# Patient Record
Sex: Male | Born: 1995 | Race: Black or African American | Hispanic: No | Marital: Single | Smoking: Current every day smoker
Health system: Southern US, Community
[De-identification: ages and names within clinical notes are randomized; demographics above are authoritative.]

## PROBLEM LIST (undated history)

## (undated) DIAGNOSIS — A64 Unspecified sexually transmitted disease: Secondary | ICD-10-CM

## (undated) DIAGNOSIS — T300 Burn of unspecified body region, unspecified degree: Secondary | ICD-10-CM

## (undated) HISTORY — PX: SKIN GRAFT: SHX250

---
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
## 2014-11-23 ENCOUNTER — Emergency Department (HOSPITAL_COMMUNITY)
Admission: EM | Admit: 2014-11-23 | Discharge: 2014-11-23 | Discharge status: Against Medical Advice | Payer: Self-pay | Attending: Emergency Medicine | Admitting: Emergency Medicine

## 2014-11-23 ENCOUNTER — Emergency Department (HOSPITAL_COMMUNITY): Payer: Medicaid - Out of State

## 2014-11-23 ENCOUNTER — Encounter (HOSPITAL_COMMUNITY): Payer: Self-pay | Admitting: Emergency Medicine

## 2014-11-23 DIAGNOSIS — R05 Cough: Secondary | ICD-10-CM | POA: Insufficient documentation

## 2014-11-23 DIAGNOSIS — R079 Chest pain, unspecified: Secondary | ICD-10-CM | POA: Insufficient documentation

## 2014-11-23 DIAGNOSIS — Z720 Tobacco use: Secondary | ICD-10-CM | POA: Insufficient documentation

## 2014-11-23 NOTE — ED Notes (Signed)
Pt states that when he breathes he has pain in center of chest.  Has been coughing up clear and yellow mucous.

## 2015-04-02 ENCOUNTER — Emergency Department (HOSPITAL_COMMUNITY)
Admission: EM | Admit: 2015-04-02 | Discharge: 2015-04-02 | Disposition: A | Discharge status: Home / Self Care | Payer: Self-pay | Attending: Emergency Medicine | Admitting: Emergency Medicine

## 2015-04-02 ENCOUNTER — Encounter (HOSPITAL_COMMUNITY): Payer: Self-pay | Admitting: *Deleted

## 2015-04-02 DIAGNOSIS — Z880 Allergy status to penicillin: Secondary | ICD-10-CM | POA: Insufficient documentation

## 2015-04-02 DIAGNOSIS — L0291 Cutaneous abscess, unspecified: Secondary | ICD-10-CM

## 2015-04-02 DIAGNOSIS — Z720 Tobacco use: Secondary | ICD-10-CM | POA: Insufficient documentation

## 2015-04-02 DIAGNOSIS — Z79899 Other long term (current) drug therapy: Secondary | ICD-10-CM | POA: Insufficient documentation

## 2015-04-02 DIAGNOSIS — L02214 Cutaneous abscess of groin: Secondary | ICD-10-CM | POA: Insufficient documentation

## 2015-04-02 DIAGNOSIS — Z792 Long term (current) use of antibiotics: Secondary | ICD-10-CM | POA: Insufficient documentation

## 2015-04-02 MED ORDER — LIDOCAINE-EPINEPHRINE-TETRACAINE (LET) SOLUTION
3.0000 mL | Freq: Once | NASAL | Status: AC
  Administered 2015-04-02: 3 mL via TOPICAL
  Filled 2015-04-02: qty 3

## 2015-04-02 MED ORDER — SULFAMETHOXAZOLE-TRIMETHOPRIM 800-160 MG PO TABS: 1.0000 | ORAL_TABLET | Freq: Two times a day (BID) | ORAL | Status: AC

## 2015-04-02 MED ORDER — HYDROCODONE-ACETAMINOPHEN 5-325 MG PO TABS: ORAL_TABLET | ORAL | Status: DC

## 2015-04-02 MED ORDER — IBUPROFEN 800 MG PO TABS: 800.0000 mg | ORAL_TABLET | Freq: Three times a day (TID) | ORAL | Status: DC

## 2015-04-02 MED ORDER — POVIDONE-IODINE 10 % EX SOLN
CUTANEOUS | Status: AC
  Administered 2015-04-02: 11:00:00
  Filled 2015-04-02: qty 118

## 2015-04-02 MED ORDER — LIDOCAINE HCL (PF) 2 % IJ SOLN
10.0000 mL | Freq: Once | INTRAMUSCULAR | Status: AC
  Administered 2015-04-02: 10 mL via INTRADERMAL
  Filled 2015-04-02: qty 10

## 2015-04-02 NOTE — ED Provider Notes (Signed)
CSN: [id number]     Arrival date & time 04/02/15  0905 History   First MD Initiated Contact with Patient 04/02/15 [phone]     Chief Complaint  Patient presents with  . Abscess     (Consider location/radiation/quality/duration/timing/severity/associated sxs/prior Treatment) HPI   Bobby Scott is a 19 y.o. male who presents to the Emergency Department complaining of painful, swollen area to the right groin.  Symptoms began 3 days ago.  States the area as small at onset and has gotten larger and more painful.  Pain is worse with walking and movement of the right leg.  He reports this is a recurring problem.  He denies abdominal pain, N/V, fever, pain or swelling to his testicle and dysuria.     History reviewed. No pertinent past medical history. Past Surgical History  Procedure Laterality Date  . Skin graft     No family history on file. Social History  Substance Use Topics  . Smoking status: Current Some Day Smoker    Types: Cigarettes  . Smokeless tobacco: None  . Alcohol Use: Yes     Comment: rarely    Review of Systems  Constitutional: Negative for fever and chills.  Gastrointestinal: Negative for nausea and vomiting.  Musculoskeletal: Negative for joint swelling and arthralgias.  Skin: Positive for color change.       Abscess   Hematological: Negative for adenopathy.  All other systems reviewed and are negative.     Allergies  Bee venom and Penicillins  Home Medications   Prior to Admission medications   Medication Sig Start Date End Date Taking? Authorizing Keionna Kinnaird  ciprofloxacin (CIPRO) 500 MG tablet Take 1 tablet (500 mg total) by mouth 2 (two) times daily. 09/25/13   Sunnie Nielsen, MD  meclizine (ANTIVERT) 25 MG tablet Take 1 tablet (25 mg total) by mouth 4 (four) times daily. 09/25/13   Sunnie Nielsen, MD  promethazine (PHENERGAN) 25 MG tablet Take 1 tablet (25 mg total) by mouth every 6 (six) hours as needed for nausea or vomiting. 12/28/13   Bethann Berkshire, MD   traMADol (ULTRAM) 50 MG tablet Take 1 tablet (50 mg total) by mouth every 6 (six) hours as needed. 12/28/13   Bethann Berkshire, MD   BP 105/71 mmHg  Pulse 90  Temp(Src) 98.3 F (36.8 C) (Oral)  Resp 18  Ht  (1.905 m)  Wt 160 lb (72.576 kg)  BMI 20.00 kg/m2  SpO2 99% Physical Exam  Constitutional: He is oriented to person, place, and time. He appears well-developed and well-nourished. No distress.  HENT:  Head: Normocephalic and atraumatic.  Cardiovascular: Normal rate, regular rhythm and normal heart sounds.   No murmur heard. Pulmonary/Chest: Effort normal and breath sounds normal. No respiratory distress.  Abdominal: Soft. He exhibits no distension. There is no tenderness. There is no rebound and no guarding.  Musculoskeletal: Normal range of motion.  Neurological: He is alert and oriented to person, place, and time. He exhibits normal muscle tone. Coordination normal.  Skin: Skin is warm and dry. There is erythema.  3 cm Abscess to the right groin.  Mild erythema, moderate fluctuance  Nursing note and vitals reviewed.   ED Course  Procedures (including critical care time) Labs Review Labs Reviewed - No data to display  Imaging Review No results found. I have personally reviewed and evaluated these images and lab results as part of my medical decision-making.   EKG Interpretation None      INCISION AND DRAINAGE Performed by:  TRIPLETT,TAMMY L. Consent: Verbal consent obtained. Risks and benefits: risks, benefits and alternatives were discussed Type: abscess  Body area: right groin  Anesthesia: local infiltration  Incision was made with a #11 scalpel.  Local anesthetic: LET,  lidocaine 2 % w/o epinephrine  Anesthetic total: 3 ml,  2 ml  Complexity: complex Blunt dissection to break up loculations  Drainage: purulent  Drainage amount: moderate  Packing material: 1/4 in iodoform gauze  Patient tolerance: Patient tolerated the procedure well with no  immediate complications.    MDM   Final diagnoses:  Abscess    Pt is well appearing.  Non-toxic.  Vitals stable.  Abscess to the groin without involvement to the testicle.  He agrees to warm soaks and packing removal in 2 days.  Advised to return here for any worsening symptoms    Pauline Aus, PA-C 04/02/15 1111  Bethann Berkshire, MD 04/02/15 1432

## 2015-04-02 NOTE — ED Notes (Signed)
Gauze dressing applied to site per PA request.

## 2015-04-02 NOTE — Discharge Instructions (Signed)
Abscess °An abscess (boil or furuncle) is an infected area on or under the skin. This area is filled with yellowish-white fluid (pus) and other material (debris). °HOME CARE  °· Only take medicines as told by your doctor. °· If you were given antibiotic medicine, take it as directed. Finish the medicine even if you start to feel better. °· If gauze is used, follow your doctor's directions for changing the gauze. °· To avoid spreading the infection: °¨ Keep your abscess covered with a bandage. °¨ Wash your hands well. °¨ Do not share personal care items, towels, or whirlpools with others. °¨ Avoid skin contact with others. °· Keep your skin and clothes clean around the abscess. °· Keep all doctor visits as told. °GET HELP RIGHT AWAY IF:  °· You have more pain, puffiness (swelling), or redness in the wound site. °· You have more fluid or blood coming from the wound site. °· You have muscle aches, chills, or you feel sick. °· You have a fever. °MAKE SURE YOU:  °· Understand these instructions. °· Will watch your condition. °· Will get help right away if you are not doing well or get worse. °Document Released: 12/13/2007 Document Revised: 12/26/2011 Document Reviewed: 09/08/2011 °[employer]® Patient Information ©2015 [employer]. This information is not intended to replace advice given to you by your health care provider. Make sure you discuss any questions you have with your health care provider. ° °

## 2015-04-02 NOTE — ED Notes (Signed)
Pt c/o "boil" to right inner thigh/groin area causing a lot pain that started 3-4 days ago. Skin is red and swollen, very tender. No pain in testicles. Pt reports increasing pain and edema over the last several days.

## 2015-04-02 NOTE — ED Notes (Signed)
Abscess to right inner upper thigh first noticed 3 days ago. Pt states area has not been draining.

## 2015-04-05 ENCOUNTER — Emergency Department (HOSPITAL_COMMUNITY)
Admission: EM | Admit: 2015-04-05 | Discharge: 2015-04-05 | Disposition: A | Discharge status: Home / Self Care | Payer: Self-pay | Attending: Emergency Medicine | Admitting: Emergency Medicine

## 2015-04-05 ENCOUNTER — Encounter (HOSPITAL_COMMUNITY): Payer: Self-pay | Admitting: Emergency Medicine

## 2015-04-05 DIAGNOSIS — Z720 Tobacco use: Secondary | ICD-10-CM | POA: Insufficient documentation

## 2015-04-05 DIAGNOSIS — Z791 Long term (current) use of non-steroidal anti-inflammatories (NSAID): Secondary | ICD-10-CM | POA: Insufficient documentation

## 2015-04-05 DIAGNOSIS — Z5189 Encounter for other specified aftercare: Secondary | ICD-10-CM

## 2015-04-05 DIAGNOSIS — Z880 Allergy status to penicillin: Secondary | ICD-10-CM | POA: Insufficient documentation

## 2015-04-05 DIAGNOSIS — Z4801 Encounter for change or removal of surgical wound dressing: Secondary | ICD-10-CM | POA: Insufficient documentation

## 2015-04-05 NOTE — ED Notes (Signed)
Pt made aware to return if symptoms worsen or if any life threatening symptoms occur.   

## 2015-04-05 NOTE — Discharge Instructions (Signed)

## 2015-04-05 NOTE — ED Provider Notes (Signed)
CSN: [id number]     Arrival date & time 04/05/15  4098 History  This chart was scribed for Bobby Octave, MD by Tanda Rockers, ED Scribe. This patient was seen in room [facility]06/[facility] and the patient's care was started at 9:15 AM.  Chief Complaint  Patient presents with  . Wound Check   The history is provided by the patient. No language interpreter was used.     HPI Comments: Bobby Scott is a 19 y.o. male who presents to the Emergency Department for wound check. Pt was seen in ED on Friday, 04/02/2015 (approxiamtely 4 days ago) for abscess to right groin x 3 days. He had I&D with packing done at that time and was prescribed antibiotics. Pt reports persistent pain to the area and drainage. He states he has been complaint with the antibiotics and had 3 more days left. Denies fever, chills, nausea, vomiting, or any other associated symptoms.    History reviewed. No pertinent past medical history. Past Surgical History  Procedure Laterality Date  . Skin graft     History reviewed. No pertinent family history. Social History  Substance Use Topics  . Smoking status: Current Some Day Smoker    Types: Cigarettes  . Smokeless tobacco: None  . Alcohol Use: Yes     Comment: rarely    Review of Systems  A complete 10 system review of systems was obtained and all systems are negative except as noted in the HPI and PMH.    Allergies  Bee venom and Penicillins  Home Medications   Prior to Admission medications   Medication Sig Start Date End Date Taking? Authorizing Provider  HYDROcodone-acetaminophen (NORCO/VICODIN) 5-325 MG per tablet Take one-two tabs po q 4-6 hrs prn pain 04/02/15  Yes Tammy Triplett, PA-C  naproxen sodium (ANAPROX) 220 MG tablet Take 220 mg by mouth 2 (two) times daily with a meal.   Yes Historical Provider, MD  sulfamethoxazole-trimethoprim (BACTRIM DS,SEPTRA DS) 800-160 MG per tablet Take 1 tablet by mouth 2 (two) times daily. For 10 days 04/02/15 04/09/15 Yes  Tammy Triplett, PA-C  ibuprofen (ADVIL,MOTRIN) 800 MG tablet Take 1 tablet (800 mg total) by mouth 3 (three) times daily. Patient not taking: Reported on 04/05/2015 04/02/15   Pauline Aus, PA-C   Triage Vitals: BP 119/61 mmHg  Pulse 72  Temp(Src) 97.6 F (36.4 C) (Oral)  Resp 18  Ht  (1.905 m)  Wt 160 lb (72.576 kg)  BMI 20.00 kg/m2  SpO2 100%   Physical Exam  Constitutional: He is oriented to person, place, and time. He appears well-developed and well-nourished. No distress.  Smells like marijuana  HENT:  Head: Normocephalic and atraumatic.  Mouth/Throat: Oropharynx is clear and moist. No oropharyngeal exudate.  Eyes: Conjunctivae and EOM are normal. Pupils are equal, round, and reactive to light.  Neck: Normal range of motion. Neck supple.  No meningismus.  Cardiovascular: Normal rate, regular rhythm, normal heart sounds and intact distal pulses.   No murmur heard. Pulmonary/Chest: Effort normal and breath sounds normal. No respiratory distress.  Abdominal: Soft. There is no tenderness. There is no rebound and no guarding.  Musculoskeletal: Normal range of motion. He exhibits no edema or tenderness.  Neurological: He is alert and oriented to person, place, and time. No cranial nerve deficit. He exhibits normal muscle tone. Coordination normal.  No ataxia on finger to nose bilaterally. No pronator drift. 5/5 strength throughout. CN 2-12 intact. Negative Romberg. Equal grip strength. Sensation intact. Gait is normal.  Skin: Skin is warm.  Healing abscess to right inner thigh.  Packing removed. Mild induration. No fluctuance. No surrounding erythema. No involvement of scrotum.   Psychiatric: He has a normal mood and affect. His behavior is normal.  Nursing note and vitals reviewed.   ED Course  Procedures (including critical care time)  DIAGNOSTIC STUDIES: Oxygen Saturation is 100% on RA, normal by my interpretation.    COORDINATION OF CARE: 9:20 AM-Discussed  treatment plan which includes packing removal, continuation of antibiotics, and taking warm baths with pt at bedside and pt agreed to plan.   Labs Review Labs Reviewed - No data to display  Imaging Review No results found.   EKG Interpretation None      MDM   Final diagnoses:  Wound check, abscess   Wound check to the abscess drained in right inner thigh 3 days ago. No fever no vomiting. Taking antibiotics as prescribed.  Packing removed. Wound appears to be healing appropriately, no fluctuance or significant erythema. Mild induration persists.  Patient instructed on continuing warm soaks, antibiotics, routine follow with PCP, urgent return precautions discussed.  I personally performed the services described in this documentation, which was scribed in my presence. The recorded information has been reviewed and is accurate.     Stormey Wilborn RGlynn Octave9/26/16 [phone]

## 2015-04-05 NOTE — ED Notes (Signed)
Dressing applied to wound.  

## 2015-04-05 NOTE — ED Notes (Signed)
PT states here for a wound re-check and packing removed that was drained on 04/01/15 at this ED.

## 2015-05-03 ENCOUNTER — Encounter (HOSPITAL_COMMUNITY): Payer: Self-pay | Admitting: Emergency Medicine

## 2015-05-03 ENCOUNTER — Emergency Department (HOSPITAL_COMMUNITY)
Admission: EM | Admit: 2015-05-03 | Discharge: 2015-05-04 | Disposition: A | Discharge status: Home / Self Care | Payer: Self-pay | Attending: Emergency Medicine | Admitting: Emergency Medicine

## 2015-05-03 DIAGNOSIS — Y9367 Activity, basketball: Secondary | ICD-10-CM | POA: Insufficient documentation

## 2015-05-03 DIAGNOSIS — Z880 Allergy status to penicillin: Secondary | ICD-10-CM | POA: Insufficient documentation

## 2015-05-03 DIAGNOSIS — Y998 Other external cause status: Secondary | ICD-10-CM | POA: Insufficient documentation

## 2015-05-03 DIAGNOSIS — Y92310 Basketball court as the place of occurrence of the external cause: Secondary | ICD-10-CM | POA: Insufficient documentation

## 2015-05-03 DIAGNOSIS — S9031XA Contusion of right foot, initial encounter: Secondary | ICD-10-CM | POA: Insufficient documentation

## 2015-05-03 DIAGNOSIS — Z720 Tobacco use: Secondary | ICD-10-CM | POA: Insufficient documentation

## 2015-05-03 DIAGNOSIS — Z791 Long term (current) use of non-steroidal anti-inflammatories (NSAID): Secondary | ICD-10-CM | POA: Insufficient documentation

## 2015-05-03 DIAGNOSIS — W1839XA Other fall on same level, initial encounter: Secondary | ICD-10-CM | POA: Insufficient documentation

## 2015-05-03 NOTE — ED Notes (Signed)
Pt c/o rt great toe pain and pain to the top of that foot and knee after falling yesterday.

## 2015-05-04 ENCOUNTER — Emergency Department (HOSPITAL_COMMUNITY): Payer: Self-pay

## 2015-05-04 MED ORDER — IBUPROFEN 800 MG PO TABS: 800.0000 mg | ORAL_TABLET | Freq: Three times a day (TID) | ORAL | Status: DC

## 2015-05-04 NOTE — ED Provider Notes (Signed)
CSN: [id number]     Arrival date & time 05/03/15  2324 History   First MD Initiated Contact with Patient 05/03/15 2335     Chief Complaint  Patient presents with  . Foot Pain     (Consider location/radiation/quality/duration/timing/severity/associated sxs/prior Treatment) Patient is a 19 y.o. male presenting with lower extremity pain. The history is provided by the patient.  Foot Pain This is a new problem. The current episode started yesterday (Pt reports pain and swelling in his right great toe and dorsal foot when he fell yesteday landing awkwardly on the foot while playing basketball.  He is unsure of the exact position of the foot when the fall occured.). The problem occurs constantly. The problem has been gradually worsening. Associated symptoms include arthralgias and joint swelling. Pertinent negatives include no fever, myalgias, numbness or weakness. The symptoms are aggravated by walking and bending. He has tried NSAIDs for the symptoms. The treatment provided no relief.    History reviewed. No pertinent past medical history. Past Surgical History  Procedure Laterality Date  . Skin graft     History reviewed. No pertinent family history. Social History  Substance Use Topics  . Smoking status: Current Some Day Smoker    Types: Cigarettes  . Smokeless tobacco: None  . Alcohol Use: Yes     Comment: rarely    Review of Systems  Constitutional: Negative for fever.  Musculoskeletal: Positive for joint swelling and arthralgias. Negative for myalgias.  Neurological: Negative for weakness and numbness.      Allergies  Bee venom and Penicillins  Home Medications   Prior to Admission medications   Medication Sig Start Date End Date Taking? Authorizing Provider  HYDROcodone-acetaminophen (NORCO/VICODIN) 5-325 MG per tablet Take one-two tabs po q 4-6 hrs prn pain 04/02/15   Tammy Triplett, PA-C  ibuprofen (ADVIL,MOTRIN) 800 MG tablet Take 1 tablet (800 mg total) by mouth 3  (three) times daily. 05/04/15   Burgess Amor, PA-C  naproxen sodium (ANAPROX) 220 MG tablet Take 220 mg by mouth 2 (two) times daily with a meal.    Historical Provider, MD   BP 132/74 mmHg  Pulse 74  Temp(Src) 98 F (36.7 C)  Resp 18  Ht  (1.905 m)  Wt 150 lb (68.04 kg)  BMI 18.75 kg/m2  SpO2 99% Physical Exam  Constitutional: He appears well-developed and well-nourished.  HENT:  Head: Atraumatic.  Neck: Normal range of motion.  Cardiovascular:  Pulses:      Dorsalis pedis pulses are 2+ on the right side, and 2+ on the left side.  Pulses equal bilaterally.  Distal sensation intact.  Musculoskeletal:       Left foot: There is tenderness, bony tenderness and swelling. There is normal capillary refill, no crepitus and no deformity.       Feet:  ttp right distal dorsal foot along with mild edema at the mtp of great toe.    Neurological: He is alert. He has normal strength. He displays normal reflexes. No sensory deficit.  Skin: Skin is warm and dry.  Psychiatric: He has a normal mood and affect.    ED Course  Procedures (including critical care time) Labs Review Labs Reviewed - No data to display  Imaging Review Dg Foot Complete Right  05/04/2015  CLINICAL DATA:  19 year old male with history of trauma after rolling his right foot while playing basketball yesterday. Pain across the top of the metatarsals. EXAM: RIGHT FOOT COMPLETE - 3+ VIEW COMPARISON:  Right foot radiograph  07/30/2010. FINDINGS: Multiple views of the right foot demonstrate no acute displaced fracture, subluxation, dislocation, or soft tissue abnormality. IMPRESSION: No acute radiographic abnormality of the right foot. Electronically Signed   By: Trudie Reedaniel  Entrikin M.D.   On: 05/04/2015 00:32   I have personally reviewed and evaluated these images and lab results as part of my medical decision-making.   EKG Interpretation None      MDM   Final diagnoses:  Foot contusion, right, initial encounter      Radiological studies were viewed, interpreted and considered during the medical decision making and disposition process. I agree with radiologists reading.  Results were also discussed with patient. Pt placed in post op shoe.  Rest, ice, elevation, ibuprofen.  Ortho referral given for prn f/u if not improved over the next 10 days.  The patient appears reasonably screened and/or stabilized for discharge and I doubt any other medical condition or other [facility] requiring further screening, evaluation, or treatment in the ED at this time prior to discharge.      Burgess AmorJulie Kiaria Quinnell, PA-C 05/04/15 0050  Layla MawKristen N Ward, DO 05/04/15 [phone]

## 2015-05-04 NOTE — Discharge Instructions (Signed)

## 2015-09-19 ENCOUNTER — Encounter (HOSPITAL_COMMUNITY): Payer: Self-pay | Admitting: *Deleted

## 2015-09-19 ENCOUNTER — Emergency Department (HOSPITAL_COMMUNITY)
Admission: EM | Admit: 2015-09-19 | Discharge: 2015-09-19 | Disposition: A | Discharge status: Home / Self Care | Payer: Self-pay | Attending: Emergency Medicine | Admitting: Emergency Medicine

## 2015-09-19 DIAGNOSIS — R21 Rash and other nonspecific skin eruption: Secondary | ICD-10-CM | POA: Insufficient documentation

## 2015-09-19 DIAGNOSIS — F17210 Nicotine dependence, cigarettes, uncomplicated: Secondary | ICD-10-CM | POA: Insufficient documentation

## 2015-09-19 DIAGNOSIS — Z791 Long term (current) use of non-steroidal anti-inflammatories (NSAID): Secondary | ICD-10-CM | POA: Insufficient documentation

## 2015-09-19 DIAGNOSIS — Z880 Allergy status to penicillin: Secondary | ICD-10-CM | POA: Insufficient documentation

## 2015-09-19 NOTE — ED Notes (Signed)
Pt reports a rash to scrotum for 3 days . The rash does not itch and there is no pain to area. Pt denies any other Sx's of STD but wants to be checked.

## 2015-09-19 NOTE — ED Provider Notes (Signed)
CSN: [id number]     Arrival date & time 09/19/15  1829 History  By signing my name below, I, Evon Slack, attest that this documentation has been prepared under the direction and in the presence of Jacoby Ritsema, PA-C. Electronically Signed: Evon Slack, ED Scribe. 09/19/2015. 7:25 PM.    Chief Complaint  Patient presents with  . Rash  . SEXUALLY TRANSMITTED DISEASE    The history is provided by the patient. No language interpreter was used.   HPI Comments: KHALIB FENDLEY is a 20 y.o. male who presents to the Emergency Department complaining of red rash to his scrotum onset 1 day prior. Pt denies pain or itching of the rash. The rash is described as small, red bumps. Denies fluid or purulence from the rash. He endorses recent unprotected intercourse. Denies known STDs in his partner. Pt does report that he does sweat a lot around his his scrotum. Pt denies new condoms, soaps, detergents, clothing or any other new changes to personal hygeine. Pt denies fever, abdominal pain, dysuria, penile discharge, penile pain, scrotal swelling, testicular pain or open sores. He does not want STD testing today. He has no other complaints.   History reviewed. No pertinent past medical history. Past Surgical History  Procedure Laterality Date  . Skin graft     History reviewed. No pertinent family history. Social History  Substance Use Topics  . Smoking status: Current Some Day Smoker    Types: Cigarettes  . Smokeless tobacco: None  . Alcohol Use: Yes     Comment: rarely    Review of Systems  Constitutional: Negative for fever and chills.  Genitourinary: Negative for dysuria, hematuria, discharge, scrotal swelling, genital sores, penile pain and testicular pain.  Skin: Positive for rash.  All other systems reviewed and are negative.     Allergies  Bee venom and Penicillins  Home Medications   Prior to Admission medications   Medication Sig Start Date End Date Taking?  Authorizing Provider  HYDROcodone-acetaminophen (NORCO/VICODIN) 5-325 MG per tablet Take one-two tabs po q 4-6 hrs prn pain 04/02/15   Tammy Triplett, PA-C  ibuprofen (ADVIL,MOTRIN) 800 MG tablet Take 1 tablet (800 mg total) by mouth 3 (three) times daily. 05/04/15   Burgess Amor, PA-C  naproxen sodium (ANAPROX) 220 MG tablet Take 220 mg by mouth 2 (two) times daily with a meal.    Historical Provider, MD   BP 130/53 mmHg  Pulse 60  Temp(Src) 98.4 F (36.9 C) (Oral)  Resp 16  Ht  (1.905 m)  Wt 65.318 kg  BMI 18.00 kg/m2  SpO2 99%   Physical Exam  Constitutional: He is oriented to person, place, and time. He appears well-developed and well-nourished. No distress.  Nontoxic appearing  HENT:  Head: Normocephalic and atraumatic.  Eyes: Conjunctivae and EOM are normal.  Neck: Neck supple. No tracheal deviation present.  Cardiovascular: Normal rate.   Pulmonary/Chest: Effort normal. No respiratory distress.  Abdominal: Soft. There is no tenderness.  Genitourinary: Testes normal and penis normal. Right testis shows no mass, no swelling and no tenderness. Left testis shows no mass, no swelling and no tenderness. Circumcised. No penile tenderness. No discharge found.  Erythematous, papular rash noted to scrotum. No involvement of suprapubic area or legs. No vesicles, pustules or crusting. No scaling, targetoid lesions or central clearing. No central umbilication. No open sores. No tenderness over the scrotum. Penis without discharge. No testicular swelling or pain. No inguinal adenopathy.   Musculoskeletal: Normal range of motion.  Lymphadenopathy:       Right: No inguinal adenopathy present.       Left: No inguinal adenopathy present.  Neurological: He is alert and oriented to person, place, and time.  Skin: Skin is warm and dry.  Psychiatric: He has a normal mood and affect. His behavior is normal.  Nursing note and vitals reviewed.   ED Course  Procedures (including critical care  time) DIAGNOSTIC STUDIES: Oxygen Saturation is 99% on RA, normal by my interpretation.    COORDINATION OF CARE: 7:28 PM-Discussed treatment plan with pt at bedside and pt agreed to plan.   Labs Review Labs Reviewed - No data to display  Imaging Review No results found.    EKG Interpretation None      MDM   Final diagnoses:  Rash   20 year old male presenting with scrotal rash x 1 day. No associated symptoms. Recent unprotected intercourse. Partner is asymptomatic. Afebrile and non-toxic appearing. Abdomen is soft, non-tender. Diffuse, erythematous, papular rash noted to scrotum. No vesicles or pustules. No open sores. Rash is not consistent with herpes, syphilis or chancroid. Otherwise normal male external genitalia. Pt declines offered STD testing. Discussed safe sex practices. Pt is to monitor the rash and follow up with health department as needed. If rash does not improve, will follow up with PCP. I have discussed signs and symptoms of STDs that he should watch for. Also discussed good hygiene. Return precautions given in discharge paperwork and discussed with pt at bedside. Pt stable for discharge  I personally performed the services described in this documentation, which was scribed in my presence. The recorded information has been reviewed and is accurate.      Rolm GalaStevi Alorah Mcree, PA-C 09/19/15 1952  Gwyneth SproutWhitney Plunkett, MD 09/20/15 1429

## 2015-09-19 NOTE — Discharge Instructions (Signed)
Follow up with the [facility] Department for STD testing as needed. Monitor your rash and if it changes, becomes painful, has small, fluid filled bumps, or you develop fever, chills, abdominal pain, penile discharge, penile pain, or testicular pain then go to health department or return to ED.   Safe Sex Safe sex is about reducing the risk of giving or getting a sexually transmitted disease (STD). STDs are spread through sexual contact involving the genitals, mouth, or rectum. Some STDs can be cured and others cannot. Safe sex can also prevent unintended pregnancies.  WHAT ARE SOME SAFE SEX PRACTICES?  Limit your sexual activity to only one partner who is having sex with only you.  Talk to your partner about his or her past partners, past STDs, and drug use.  Use a condom every time you have sexual intercourse. This includes vaginal, oral, and anal sexual activity. Both females and males should wear condoms during oral sex. Only use latex or polyurethane condoms and water-based lubricants. Using petroleum-based lubricants or oils to lubricate a condom will weaken the condom and increase the chance that it will break. The condom should be in place from the beginning to the end of sexual activity. Wearing a condom reduces, but does not completely eliminate, your risk of getting or giving an STD. STDs can be spread by contact with infected body fluids and skin.  Get vaccinated for hepatitis B and HPV.  Avoid alcohol and recreational drugs, which can affect your judgment. You may forget to use a condom or participate in high-risk sex.  For females, avoid douching after sexual intercourse. Douching can spread an infection farther into the reproductive tract.  Check your body for signs of sores, blisters, rashes, or unusual discharge. See your health care provider if you notice any of these signs.  Avoid sexual contact if you have symptoms of an infection or are being treated for an STD. If  you or your partner has herpes, avoid sexual contact when blisters are present. Use condoms at all other times.  If you are at risk of being infected with HIV, it is recommended that you take a prescription medicine daily to prevent HIV infection. This is called pre-exposure prophylaxis (PrEP). You are considered at risk if:  You are a man who has sex with other men (MSM).  You are a heterosexual man or woman who is sexually active with more than one partner.  You take drugs by injection.  You are sexually active with a partner who has HIV.  Talk with your health care provider about whether you are at high risk of being infected with HIV. If you choose to begin PrEP, you should first be tested for HIV. You should then be tested every 3 months for as long as you are taking PrEP.  See your health care provider for regular screenings, exams, and tests for other STDs. Before having sex with a new partner, each of you should be screened for STDs and should talk about the results with each other. WHAT ARE THE BENEFITS OF SAFE SEX?   There is less chance of getting or giving an STD.  You can prevent unwanted or unintended pregnancies.  By discussing safe sex concerns with your partner, you may increase feelings of intimacy, comfort, trust, and honesty between the two of you.   This information is not intended to replace advice given to you by your health care provider. Make sure you discuss any questions you have with your  health care provider.   Document Released: 08/03/2004 Document Revised: 07/17/2014 Document Reviewed: 12/18/2011 Elsevier Interactive Patient Education 2016 [employer] Sexually Transmitted Disease A sexually transmitted disease (STD) is a disease or infection that may be passed (transmitted) from person to person, usually during sexual activity. This may happen by way of saliva, semen, blood, vaginal mucus, or urine. Common STDs  include:  Gonorrhea.  Chlamydia.  Syphilis.  HIV and AIDS.  Genital herpes.  Hepatitis B and C.  Trichomonas.  Human papillomavirus (HPV).  Pubic lice.  Scabies.  Mites.  Bacterial vaginosis. WHAT ARE CAUSES OF STDs? An STD may be caused by bacteria, a virus, or parasites. STDs are often transmitted during sexual activity if one person is infected. However, they may also be transmitted through nonsexual means. STDs may be transmitted after:   Sexual intercourse with an infected person.  Sharing sex toys with an infected person.  Sharing needles with an infected person or using unclean piercing or tattoo needles.  Having intimate contact with the genitals, mouth, or rectal areas of an infected person.  Exposure to infected fluids during birth. WHAT ARE THE SIGNS AND SYMPTOMS OF STDs? Different STDs have different symptoms. Some people may not have any symptoms. If symptoms are present, they may include:  Painful or bloody urination.  Pain in the pelvis, abdomen, vagina, anus, throat, or eyes.  A skin rash, itching, or irritation.  Growths, ulcerations, blisters, or sores in the genital and anal areas.  Abnormal vaginal discharge with or without bad odor.  Penile discharge in men.  Fever.  Pain or bleeding during sexual intercourse.  Swollen glands in the groin area.  Yellow skin and eyes (jaundice). This is seen with hepatitis.  Swollen testicles.  Infertility.  Sores and blisters in the mouth. HOW ARE STDs DIAGNOSED? To make a diagnosis, your health care provider may:  Take a medical history.  Perform a physical exam.  Take a sample of any discharge to examine.  Swab the throat, cervix, opening to the penis, rectum, or vagina for testing.  Test a sample of your first morning urine.  Perform blood tests.  Perform a Pap test, if this applies.  Perform a colposcopy.  Perform a laparoscopy. HOW ARE STDs TREATED? Treatment depends on  the STD. Some STDs may be treated but not cured.  Chlamydia, gonorrhea, trichomonas, and syphilis can be cured with antibiotic medicine.  Genital herpes, hepatitis, and HIV can be treated, but not cured, with prescribed medicines. The medicines lessen symptoms.  Genital warts from HPV can be treated with medicine or by freezing, burning (electrocautery), or surgery. Warts may come back.  HPV cannot be cured with medicine or surgery. However, abnormal areas may be removed from the cervix, vagina, or vulva.  If your diagnosis is confirmed, your recent sexual partners need treatment. This is true even if they are symptom-free or have a negative culture or evaluation. They should not have sex until their health care providers say it is okay.  Your health care provider may test you for infection again 3 months after treatment. HOW CAN I REDUCE MY RISK OF GETTING AN STD? Take these steps to reduce your risk of getting an STD:  Use latex condoms, dental dams, and water-soluble lubricants during sexual activity. Do not use petroleum jelly or oils.  Avoid having multiple sex partners.  Do not have sex with someone who has other sex partners  Do not have sex with anyone you do not know or who  is at high risk for an STD.  Avoid risky sex practices that can break your skin.  Do not have sex if you have open sores on your mouth or skin.  Avoid drinking too much alcohol or taking illegal drugs. Alcohol and drugs can affect your judgment and put you in a vulnerable position.  Avoid engaging in oral and anal sex acts.  Get vaccinated for HPV and hepatitis. If you have not received these vaccines in the past, talk to your health care provider about whether one or both might be right for you.  If you are at risk of being infected with HIV, it is recommended that you take a prescription medicine daily to prevent HIV infection. This is called pre-exposure prophylaxis (PrEP). You are considered at risk  if:  You are a man who has sex with other men (MSM).  You are a heterosexual man or woman and are sexually active with more than one partner.  You take drugs by injection.  You are sexually active with a partner who has HIV.  Talk with your health care provider about whether you are at high risk of being infected with HIV. If you choose to begin PrEP, you should first be tested for HIV. You should then be tested every 3 months for as long as you are taking PrEP. WHAT SHOULD I DO IF I THINK I HAVE AN STD?  See your health care provider.  Tell your sexual partner(s). They should be tested and treated for any STDs.  Do not have sex until your health care provider says it is okay. WHEN SHOULD I GET IMMEDIATE MEDICAL CARE? Contact your health care provider right away if:   You have severe abdominal pain.  You are a man and notice swelling or pain in your testicles.  You are a woman and notice swelling or pain in your vagina.   This information is not intended to replace advice given to you by your health care provider. Make sure you discuss any questions you have with your health care provider.   Document Released: 09/16/2002 Document Revised: 07/17/2014 Document Reviewed: 01/14/2013 Elsevier Interactive Patient Education [employer]

## 2017-11-01 ENCOUNTER — Emergency Department (HOSPITAL_COMMUNITY)
Admission: EM | Admit: 2017-11-01 | Discharge: 2017-11-01 | Disposition: A | Discharge status: Home / Self Care | Payer: Self-pay | Attending: Emergency Medicine | Admitting: Emergency Medicine

## 2017-11-01 ENCOUNTER — Emergency Department (HOSPITAL_COMMUNITY): Payer: Self-pay

## 2017-11-01 ENCOUNTER — Encounter (HOSPITAL_COMMUNITY): Payer: Self-pay | Admitting: Emergency Medicine

## 2017-11-01 ENCOUNTER — Other Ambulatory Visit: Payer: Self-pay

## 2017-11-01 DIAGNOSIS — N490 Inflammatory disorders of seminal vesicle: Secondary | ICD-10-CM

## 2017-11-01 DIAGNOSIS — R52 Pain, unspecified: Secondary | ICD-10-CM

## 2017-11-01 DIAGNOSIS — F17210 Nicotine dependence, cigarettes, uncomplicated: Secondary | ICD-10-CM | POA: Insufficient documentation

## 2017-11-01 LAB — CBC WITH DIFFERENTIAL/PLATELET
Basophils Absolute: 0 10*3/uL (ref 0.0–0.1)
Basophils Relative: 0 %
EOS PCT: 2 %
Eosinophils Absolute: 0.1 10*3/uL (ref 0.0–0.7)
HCT: 42.3 % (ref 39.0–52.0)
Hemoglobin: 13.6 g/dL (ref 13.0–17.0)
LYMPHS ABS: 2 10*3/uL (ref 0.7–4.0)
LYMPHS PCT: 38 %
MCH: 28.2 pg (ref 26.0–34.0)
MCHC: 32.2 g/dL (ref 30.0–36.0)
MCV: 87.8 fL (ref 78.0–100.0)
MONO ABS: 0.4 10*3/uL (ref 0.1–1.0)
Monocytes Relative: 8 %
Neutro Abs: 2.8 10*3/uL (ref 1.7–7.7)
Neutrophils Relative %: 52 %
Platelets: 210 10*3/uL (ref 150–400)
RBC: 4.82 MIL/uL (ref 4.22–5.81)
RDW: 13.6 % (ref 11.5–15.5)
WBC: 5.3 10*3/uL (ref 4.0–10.5)

## 2017-11-01 LAB — URINALYSIS, ROUTINE W REFLEX MICROSCOPIC
Bilirubin Urine: NEGATIVE
GLUCOSE, UA: NEGATIVE mg/dL
Ketones, ur: NEGATIVE mg/dL
NITRITE: NEGATIVE
PROTEIN: 30 mg/dL — AB
SPECIFIC GRAVITY, URINE: 1.025 (ref 1.005–1.030)
pH: 6 (ref 5.0–8.0)

## 2017-11-01 LAB — BASIC METABOLIC PANEL
Anion gap: 7 (ref 5–15)
BUN: 13 mg/dL (ref 6–20)
CALCIUM: 9.6 mg/dL (ref 8.9–10.3)
CO2: 28 mmol/L (ref 22–32)
CREATININE: 0.98 mg/dL (ref 0.61–1.24)
Chloride: 106 mmol/L (ref 101–111)
GFR calc Af Amer: >60 mL/min (ref >60)
GFR calc non Af Amer: >60 mL/min (ref >60)
GLUCOSE: 96 mg/dL (ref 65–99)
Potassium: 4 mmol/L (ref 3.5–5.1)
Sodium: 141 mmol/L (ref 135–145)

## 2017-11-01 MED ORDER — CIPROFLOXACIN HCL 500 MG PO TABS: 500.0000 mg | ORAL_TABLET | Freq: Two times a day (BID) | ORAL | 0 refills | Status: DC

## 2017-11-01 MED ORDER — IOPAMIDOL (ISOVUE-300) INJECTION 61%
100.0000 mL | Freq: Once | INTRAVENOUS | Status: AC | PRN
  Administered 2017-11-01: 100 mL via INTRAVENOUS

## 2017-11-01 MED ORDER — IBUPROFEN 600 MG PO TABS: 600.0000 mg | ORAL_TABLET | Freq: Four times a day (QID) | ORAL | 0 refills | Status: DC | PRN

## 2017-11-01 NOTE — ED Triage Notes (Signed)
Pt states he thinks he has a hernia to the groin x 3 days

## 2017-11-01 NOTE — ED Provider Notes (Signed)
[facility] EMERGENCY DEPARTMENT Provider Note   CSN: [id number] Arrival date & time: 11/01/17  1107     History   Chief Complaint Chief Complaint  Patient presents with  . Hernia    HPI Margaretha SheffieldMichael K Coble is a 22 y.o. male.  Patient presents with tenderness and swelling in the left testicle and left cord.  No suprapubic or inguinal swelling.  He is sexually active with his wife.  No fever, sweats, chills, dysuria.  He is normally healthy.  Palpation makes pain worse.     History reviewed. No pertinent past medical history.  There are no active problems to display for this patient.   Past Surgical History:  Procedure Laterality Date  . SKIN GRAFT          Home Medications    Prior to Admission medications   Medication Sig Start Date End Date Taking? Authorizing Provider  ciprofloxacin (CIPRO) 500 MG tablet Take 1 tablet (500 mg total) by mouth 2 (two) times daily. 11/01/17   Donnetta Hutchingook, Reylynn Vanalstine, MD  HYDROcodone-acetaminophen (NORCO/VICODIN) 5-325 MG per tablet Take one-two tabs po q 4-6 hrs prn pain 04/02/15   Triplett, Tammy, PA-C  ibuprofen (ADVIL,MOTRIN) 600 MG tablet Take 1 tablet (600 mg total) by mouth every 6 (six) hours as needed. 11/01/17   Donnetta Hutchingook, Dago Jungwirth, MD  promethazine (PHENERGAN) 25 MG tablet Take 1 tablet (25 mg total) by mouth every 6 (six) hours as needed for nausea or vomiting. Patient not taking: Reported on 04/02/2015 12/28/13 04/02/15  Bethann BerkshireZammit, Joseph, MD    Family History No family history on file.  Social History Social History   Tobacco Use  . Smoking status: Current Some Day Smoker    Types: Cigarettes  . Smokeless tobacco: Never Used  Substance Use Topics  . Alcohol use: Yes    Comment: rarely  . Drug use: Yes    Types: Marijuana    Comment: last used yesterday      Allergies   Bee venom and Penicillins   Review of Systems Review of Systems  All other systems reviewed and are negative.    Physical Exam Updated Vital Signs BP  114/68   Pulse (!) 57   Temp 98 F (36.7 C) (Temporal)   Resp 18   Ht 6\' 3"  (1.905 m)   Wt 65.8 kg (145 lb)   SpO2 100%   BMI 18.12 kg/m   Physical Exam  Constitutional: He is oriented to person, place, and time. He appears well-developed and well-nourished.  HENT:  Head: Normocephalic and atraumatic.  Eyes: Conjunctivae are normal.  Neck: Neck supple.  Cardiovascular: Normal rate and regular rhythm.  Pulmonary/Chest: Effort normal and breath sounds normal.  Abdominal: Soft. Bowel sounds are normal.  Nontender abdomen.  No obvious inguinal hernia.  Genitourinary:  Genitourinary Comments: Genitourinary exam:Penis within normal limits.  Right testicle normal.  Left testicle and cord were tender to palpation.  Musculoskeletal: Normal range of motion.  Neurological: He is alert and oriented to person, place, and time.  Skin: Skin is warm and dry.  Psychiatric: He has a normal mood and affect. His behavior is normal.  Nursing note and vitals reviewed.    ED Treatments / Results  Labs (all labs ordered are listed, but only abnormal results are displayed) Labs Reviewed  URINALYSIS, ROUTINE W REFLEX MICROSCOPIC - Abnormal; Notable for the following components:      Result Value   APPearance CLOUDY (*)    Hgb urine dipstick SMALL (*)  Protein, ur 30 (*)    Leukocytes, UA LARGE (*)    WBC, UA >50 (*)    Bacteria, UA RARE (*)    All other components within normal limits  URINE CULTURE  CBC WITH DIFFERENTIAL/PLATELET  BASIC METABOLIC PANEL    EKG None  Radiology Ct Abdomen Pelvis W Contrast  Result Date: 11/01/2017 CLINICAL DATA:  Left inguinal and testicular pain with microscopic hematuria. EXAM: CT ABDOMEN AND PELVIS WITH CONTRAST TECHNIQUE: Multidetector CT imaging of the abdomen and pelvis was performed using the standard protocol following bolus administration of intravenous contrast. CONTRAST:  ISOVUE-300 IOPAMIDOL (ISOVUE-300) INJECTION 61% COMPARISON:   12/28/2013 FINDINGS: Lower chest:  No contributory findings. Hepatobiliary: No focal liver abnormality.No evidence of biliary obstruction or stone. Pancreas: Unremarkable. Spleen: Unremarkable. Adrenals/Urinary Tract: Negative adrenals. No hydronephrosis or stone. Unremarkable bladder. Stomach/Bowel: No obstruction. No visible inflammation. The appendix is difficult to visualize in the absence of oral contrast. Mild prominence of submucosal veins in the proximal stomach; no true varix is seen and the splenic vein is patent. Vascular/Lymphatic: No acute vascular abnormality. No mass or adenopathy. Reproductive:There is asymmetric branching cystic appearance to the left seminal vesicle without surrounding fat inflammation or hyperenhancement. No obstructing lesion is seen. More prominent vessels in the left scrotum, but no varicocele by ultrasound. Other: No ascites or pneumoperitoneum. Musculoskeletal: No acute abnormalities. IMPRESSION: 1. Asymmetric cystic appearance of the left seminal vesicle that is new from 2015. Vesiculitis is questioned although there is no surrounding fat inflammation. No visible obstructing process. Recommend urology referral. 2. No hydronephrosis or urolithiasis. Electronically Signed   By: Marnee Spring M.D.   On: 11/01/2017 15:25   US Scrotum W/doppler  Result Date: 11/01/2017 CLINICAL DATA:  Left scrotal region tenderness for 3 days EXAM: SCROTAL ULTRASOUND DOPPLER ULTRASOUND OF THE TESTICLES TECHNIQUE: Complete ultrasound examination of the testicles, epididymis, and other scrotal structures was performed. Color and spectral Doppler ultrasound were also utilized to evaluate blood flow to the testicles. COMPARISON:  None. FINDINGS: Right testicle Measurements: 4.8 x 2.6 x 2.9 cm. No mass or microlithiasis visualized. Left testicle Measurements: 4.6 x 2.1 x 3.0 cm. No mass or microlithiasis visualized. Right epididymis:  Normal in size and appearance. Left epididymis:  Normal in  size and appearance. Hydrocele:  None visualized. Varicocele:  None visualized. Pulsed Doppler interrogation of both testes demonstrates normal low resistance arterial and venous waveforms bilaterally. No scrotal wall thickening or scrotal abscess on either side. IMPRESSION: Study within normal limits. No mass or inflammatory focus in either scrotal region. No testicular torsion on either side. Electronically Signed   By: Bretta Bang III M.D.   On: 11/01/2017 13:40    Procedures Procedures (including critical care time)  Medications Ordered in ED Medications  iopamidol (ISOVUE-300) 61 % injection 100 mL (100 mLs Intravenous Contrast Given 11/01/17 1502)     Initial Impression / Assessment and Plan / ED Course  I have reviewed the triage vital signs and the nursing notes.  Pertinent labs & imaging results that were available during my care of the patient were reviewed by me and considered in my medical decision making (see chart for details).    Patient presents with left testicular left cord tenderness.  Scrotal ultrasound was read as normal.  CT abdomen pelvis suggest vesiculitis and cystic appearance of the left seminal vesicle.  Will Rx Cipro 500 mg and ibuprofen 600 mg.  He understands that a referral to urology is mandatory.  Phone number given.  This was also discussed with the grandmother who agrees.   Final Clinical Impressions(s) / ED Diagnoses   Final diagnoses:  Vesiculitis (seminal)    ED Discharge Orders        Ordered    ciprofloxacin (CIPRO) 500 MG tablet  2 times daily     11/01/17 1640    ibuprofen (ADVIL,MOTRIN) 600 MG tablet  Every 6 hours PRN     11/01/17 1640       Donnetta Hutching, MD 11/01/17 1655

## 2017-11-01 NOTE — ED Notes (Signed)
Patient transported to CT 

## 2017-11-01 NOTE — ED Notes (Signed)
Patient transported to X-ray 

## 2017-11-01 NOTE — Discharge Instructions (Signed)
You have an inflammation/infection in your genital area.  He will need to see the urologist.  Call tomorrow for an appointment.  Prescription for antibiotic and pain medicine.

## 2017-11-03 LAB — URINE CULTURE: CULTURE: NO GROWTH

## 2017-12-10 ENCOUNTER — Encounter (HOSPITAL_COMMUNITY): Payer: Self-pay | Admitting: Emergency Medicine

## 2017-12-10 ENCOUNTER — Other Ambulatory Visit: Payer: Self-pay

## 2017-12-10 ENCOUNTER — Emergency Department (HOSPITAL_COMMUNITY)
Admission: EM | Admit: 2017-12-10 | Discharge: 2017-12-10 | Disposition: A | Discharge status: Home / Self Care | Payer: Self-pay | Attending: Emergency Medicine | Admitting: Emergency Medicine

## 2017-12-10 DIAGNOSIS — R1030 Lower abdominal pain, unspecified: Secondary | ICD-10-CM | POA: Insufficient documentation

## 2017-12-10 DIAGNOSIS — Z79899 Other long term (current) drug therapy: Secondary | ICD-10-CM | POA: Insufficient documentation

## 2017-12-10 DIAGNOSIS — N50819 Testicular pain, unspecified: Secondary | ICD-10-CM | POA: Insufficient documentation

## 2017-12-10 DIAGNOSIS — F17210 Nicotine dependence, cigarettes, uncomplicated: Secondary | ICD-10-CM | POA: Insufficient documentation

## 2017-12-10 DIAGNOSIS — R369 Urethral discharge, unspecified: Secondary | ICD-10-CM | POA: Insufficient documentation

## 2017-12-10 LAB — RPR: RPR: NONREACTIVE

## 2017-12-10 LAB — GC/CHLAMYDIA PROBE AMP (~~LOC~~) NOT AT ARMC
Chlamydia: NEGATIVE
NEISSERIA GONORRHEA: NEGATIVE

## 2017-12-10 LAB — HIV ANTIBODY (ROUTINE TESTING W REFLEX): HIV Screen 4th Generation wRfx: NONREACTIVE

## 2017-12-10 MED ORDER — METRONIDAZOLE 500 MG PO TABS
2000.0000 mg | ORAL_TABLET | Freq: Once | ORAL | Status: AC
Start: 2017-12-10 — End: 2017-12-10
  Administered 2017-12-10: 2000 mg via ORAL
  Filled 2017-12-10: qty 4

## 2017-12-10 MED ORDER — CEFTRIAXONE SODIUM 250 MG IJ SOLR
250.0000 mg | Freq: Once | INTRAMUSCULAR | Status: AC
  Administered 2017-12-10: 250 mg via INTRAMUSCULAR
  Filled 2017-12-10: qty 250

## 2017-12-10 MED ORDER — AZITHROMYCIN 250 MG PO TABS
1000.0000 mg | ORAL_TABLET | Freq: Once | ORAL | Status: AC
  Administered 2017-12-10: 1000 mg via ORAL
  Filled 2017-12-10: qty 4

## 2017-12-10 MED ORDER — ONDANSETRON 4 MG PO TBDP
4.0000 mg | ORAL_TABLET | Freq: Once | ORAL | Status: AC
  Administered 2017-12-10: 4 mg via ORAL
  Filled 2017-12-10: qty 1

## 2017-12-10 NOTE — ED Triage Notes (Signed)
Pt c/o penial discharge x's 2 days

## 2017-12-10 NOTE — Discharge Instructions (Signed)
Please see the information and instructions below regarding your visit.  Your diagnoses today include:  1. Penile discharge     Tests performed today include: See side panel of your discharge paperwork for testing performed today. Vital signs are listed at the bottom of these instructions.   The most common cause of discharge meds is sexually transmitted infection.  We do not have confirmatory testing yet, so we will need to to follow your results. You were covered empirically for gonorrhea, chlamydia, and trichomonas today.  Results for gonorrhea, chlamydia, HIV, and syphilis will be available in 24 to 72 hours.  You will receive a call for any positive results.  Negative results will not receive a call.  Do not have sexual intercourse until you have your results back.  Any sexual partner within the last 3 months needs to be treated as well, and you should not have sexual intercourse with that partner until they are treated.  Medications prescribed:    Take any prescribed medications only as prescribed, and any over the counter medications only as directed on the packaging.  You were treated today with one-time doses for gonorrhea, chlamydia, and trichomonas.  Home care instructions:  Please follow any educational materials contained in this packet.   Follow-up instructions: Please follow-up with [facility] as soon as possible given your recent history of vesiculitis.  This is inflammation/infection of the seminal vesicles near the prostate.   Return instructions:  Please return to the Emergency Department if you experience worsening symptoms.  Please return the emergency department if you develop any fevers, chills, testicular pain or swelling, abdominal pain, pain with bowel movements or rectal pain.   Please return if you have any other emergent concerns.  Additional Information:   Your vital signs today were: BP 109/68 (BP Location: Right Arm)    Pulse 70    Temp 98.1 F  (36.7 C) (Oral)    Resp 16    Ht 6\' 3"  (1.905 m)    Wt 65.8 kg (145 lb)    SpO2 99%    BMI 18.12 kg/m  If your blood pressure (BP) was elevated on multiple readings during this visit above 130 for the top number or above 80 for the bottom number, please have this repeated by your primary care provider within one month. --------------  Thank you for allowing us to participate in your care today.

## 2017-12-10 NOTE — ED Notes (Signed)
Pt verbalized understanding of d/c instructions and follow up information. No c/o or signs of medication reaction

## 2017-12-10 NOTE — ED Provider Notes (Signed)
[facility] EMERGENCY DEPARTMENT Provider Note   CSN: [id number] Arrival date & time: 12/10/17  0011     History   Chief Complaint Chief Complaint  Patient presents with  . Penile Discharge    HPI Bobby Scott is a 22 y.o. male.  HPI  Patient is a 21 penile discharge.  Patient reports he noticed the last 2 days.  Patient denies any dysuria, urgency, or frequency.  Patient reports that he testicular pain, swelling, lower abdominal pain, and was diagnosed with vesiculitis.  Patient reports that this resolved with antibiotics, but he was unable to follow-up with urology yet due to missing appointment.  Patient denies any similar symptoms, specifically no testicular pain, lower abdominal pain, rectal pain, nausea, vomiting, fever or chills, inguinal rashes or lesions.  Patient reports he is active with one male partner in a monogamous relationship with his wife.  Patient is unsure if she may be having similar symptoms, or if he could have been exposed to STI.  History reviewed. No pertinent past medical history.  There are no active problems to display for this patient.   Past Surgical History:  Procedure Laterality Date  . SKIN GRAFT          Home Medications    Prior to Admission medications   Medication Sig Start Date End Date Taking? Authorizing Provider  ciprofloxacin (CIPRO) 500 MG tablet Take 1 tablet (500 mg total) by mouth 2 (two) times daily. 11/01/17   Donnetta Hutching, MD  HYDROcodone-acetaminophen (NORCO/VICODIN) 5-325 MG per tablet Take one-two tabs po q 4-6 hrs prn pain 04/02/15   Triplett, Tammy, PA-C  ibuprofen (ADVIL,MOTRIN) 600 MG tablet Take 1 tablet (600 mg total) by mouth every 6 (six) hours as needed. 11/01/17   Donnetta Hutching, MD    Family History No family history on file.  Social History Social History   Tobacco Use  . Smoking status: Current Some Day Smoker    Types: Cigarettes  . Smokeless tobacco: Never Used  Substance Use  Topics  . Alcohol use: Yes    Comment: rarely  . Drug use: Yes    Types: Marijuana    Comment: last used yesterday      Allergies   Bee venom and Penicillins   Review of Systems Review of Systems  Constitutional: Negative for chills and fever.  Gastrointestinal: Negative for abdominal pain, nausea and vomiting.  Genitourinary: Positive for discharge. Negative for dysuria, genital sores, penile pain, scrotal swelling, testicular pain and urgency.     Physical Exam Updated Vital Signs BP 109/68 (BP Location: Right Arm)   Pulse 70   Temp 98.1 F (36.7 C) (Oral)   Resp 16   Ht 6\' 3"  (1.905 m)   Wt 65.8 kg (145 lb)   SpO2 99%   BMI 18.12 kg/m   Physical Exam  Constitutional: He appears well-developed and well-nourished. No distress.  Sitting comfortably in bed.  HENT:  Head: Normocephalic and atraumatic.  Eyes: Conjunctivae are normal. Right eye exhibits no discharge. Left eye exhibits no discharge.  EOMs normal to gross examination.  Neck: Normal range of motion.  Cardiovascular: Normal rate and regular rhythm.  Intact, 2+ radial pulse.  Pulmonary/Chest:  Normal respiratory effort. Patient converses comfortably. No audible wheeze or stridor.  Abdominal: He exhibits no distension.  Genitourinary:  Genitourinary Comments: Examination performed with EMT chaperone, Dahlia Client, present.  Circumcised male.  Patient has shotty, mobile lymphadenopathy of bilateral horizontal chains of the inguinal region.  No  other lesions of the inguinal region.  No testicular erythema, pain, or swelling, or spermatic cord tenderness.  No lesions of the penile shaft.  Small amount of active milky white discharge from the penis.  Musculoskeletal: Normal range of motion.  Neurological: He is alert.  Cranial nerves intact to gross observation. Patient moves extremities without difficulty.  Skin: Skin is warm and dry. He is not diaphoretic.  Psychiatric: He has a normal mood and affect. His  behavior is normal. Judgment and thought content normal.  Nursing note and vitals reviewed.    ED Treatments / Results  Labs (all labs ordered are listed, but only abnormal results are displayed) Labs Reviewed  RPR  HIV ANTIBODY (ROUTINE TESTING)  GC/CHLAMYDIA PROBE AMP ([place]    EKG None  Radiology No results found.  Procedures Procedures (including critical care time)  Medications Ordered in ED Medications  cefTRIAXone (ROCEPHIN) injection 250 mg (250 mg Intramuscular Given 12/10/17 0105)  azithromycin (ZITHROMAX) tablet 1,000 mg (1,000 mg Oral Given 12/10/17 0109)  metroNIDAZOLE (FLAGYL) tablet 2,000 mg (2,000 mg Oral Given 12/10/17 0108)  ondansetron (ZOFRAN-ODT) disintegrating tablet 4 mg (4 mg Oral Given 12/10/17 0107)     Initial Impression / Assessment and Plan / ED Course  I have reviewed the triage vital signs and the nursing notes.  Pertinent labs & imaging results that were available during my care of the patient were reviewed by me and considered in my medical decision making (see chart for details).     Patient is well-appearing in no acute distress.  Patient concern for STI exposure.  Examination is not consistent with epididymitis or prostatitis.  Swabs obtained, patient covered with ceftriaxone, azithromycin, metronidazole.  Patient has a vague history of allergy to penicillin, and is unable to identify the reaction he had, as he was an infant at the time.  Discussed with patient proceeding with ceftriaxone, and observing in the emergency department.  Provided patient has no reaction, can be safely discharged home at 1 hour.  Patient can return precautions for any testicular pain or swelling, or rectal pain, fevers, chills, abdominal pain.  Patient is in understanding and agrees the plan of care.  Urology referral re-provided for patient given history of vesiculitis.   Final Clinical Impressions(s) / ED Diagnoses   Final diagnoses:  Penile  discharge    ED Discharge Orders    None       Delia ChimesMurray, Rambo Sarafian B, PA-C 12/10/17 0144    Nira Connardama, Pedro Eduardo, MD 12/10/17 [phone]

## 2018-02-01 ENCOUNTER — Encounter (HOSPITAL_COMMUNITY): Payer: Self-pay

## 2018-02-01 ENCOUNTER — Emergency Department (HOSPITAL_COMMUNITY)
Admission: EM | Admit: 2018-02-01 | Discharge: 2018-02-01 | Disposition: A | Discharge status: Home / Self Care | Payer: Self-pay | Attending: Emergency Medicine | Admitting: Emergency Medicine

## 2018-02-01 DIAGNOSIS — F17210 Nicotine dependence, cigarettes, uncomplicated: Secondary | ICD-10-CM | POA: Insufficient documentation

## 2018-02-01 DIAGNOSIS — A64 Unspecified sexually transmitted disease: Secondary | ICD-10-CM | POA: Insufficient documentation

## 2018-02-01 LAB — URINALYSIS, ROUTINE W REFLEX MICROSCOPIC
Bilirubin Urine: NEGATIVE
GLUCOSE, UA: NEGATIVE mg/dL
HGB URINE DIPSTICK: NEGATIVE
Ketones, ur: NEGATIVE mg/dL
Leukocytes, UA: NEGATIVE
Nitrite: NEGATIVE
Protein, ur: NEGATIVE mg/dL
SPECIFIC GRAVITY, URINE: 1.02 (ref 1.005–1.030)
pH: 7 (ref 5.0–8.0)

## 2018-02-01 MED ORDER — DOXYCYCLINE HYCLATE 100 MG PO CAPS: 100.0000 mg | ORAL_CAPSULE | Freq: Two times a day (BID) | ORAL | 0 refills | Status: DC

## 2018-02-01 MED ORDER — STERILE WATER FOR INJECTION IJ SOLN
INTRAMUSCULAR | Status: AC
  Administered 2018-02-01: 10 mL
  Filled 2018-02-01: qty 10

## 2018-02-01 MED ORDER — DOXYCYCLINE HYCLATE 100 MG PO TABS
100.0000 mg | ORAL_TABLET | Freq: Once | ORAL | Status: AC
  Administered 2018-02-01: 100 mg via ORAL
  Filled 2018-02-01: qty 1

## 2018-02-01 MED ORDER — CEFTRIAXONE SODIUM 250 MG IJ SOLR
250.0000 mg | Freq: Once | INTRAMUSCULAR | Status: AC
  Administered 2018-02-01: 250 mg via INTRAMUSCULAR
  Filled 2018-02-01: qty 250

## 2018-02-01 NOTE — Discharge Instructions (Signed)
Take the antibiotic until gone. Use condoms. Your sexual partner needs to go to the health department or her doctor to be checked for STD's.

## 2018-02-01 NOTE — ED Triage Notes (Signed)
Pt states he wants to be checked for std, states he has penile discharge x 2 days.

## 2018-02-01 NOTE — ED Provider Notes (Signed)
[facility] EMERGENCY DEPARTMENT Provider Note   CSN: [id number] Arrival date & time: 02/01/18  1191  Time seen 01:45 AM   History   Chief Complaint No chief complaint on file.   HPI Bobby Scott is a 22 y.o. male.  HPI patient states he started having a penile discharge on July 24.  He denies seeing stains in his underwear or having dysuria.  He states the drainage is mainly clear.  He denies any change in his sexual partners.  He states he does not use condoms.  He states he had something similar before when he had chlamydia.  He denies any pain in his penis or in his scrotum or testicles.  He denies abdominal pain.  He states his sexual partner has not had any problems.  PCP Patient, No Pcp Per   History reviewed. No pertinent past medical history.  There are no active problems to display for this patient.   Past Surgical History:  Procedure Laterality Date  . SKIN GRAFT          Home Medications    Prior to Admission medications   Medication Sig Start Date End Date Taking? Authorizing Provider  ciprofloxacin (CIPRO) 500 MG tablet Take 1 tablet (500 mg total) by mouth 2 (two) times daily. 11/01/17   Donnetta Hutching, MD  doxycycline (VIBRAMYCIN) 100 MG capsule Take 1 capsule (100 mg total) by mouth 2 (two) times daily. 02/01/18   Devoria Albe, MD  HYDROcodone-acetaminophen (NORCO/VICODIN) 5-325 MG per tablet Take one-two tabs po q 4-6 hrs prn pain 04/02/15   Triplett, Tammy, PA-C  ibuprofen (ADVIL,MOTRIN) 600 MG tablet Take 1 tablet (600 mg total) by mouth every 6 (six) hours as needed. 11/01/17   Donnetta Hutching, MD    Family History No family history on file.  Social History Social History   Tobacco Use  . Smoking status: Current Some Day Smoker    Types: Cigarettes  . Smokeless tobacco: Never Used  Substance Use Topics  . Alcohol use: Yes    Comment: rarely  . Drug use: Yes    Types: Marijuana    Comment: last used yesterday      Allergies   Bee venom and  Penicillins   Review of Systems Review of Systems  All other systems reviewed and are negative.    Physical Exam Updated Vital Signs BP (!) 115/59   Pulse 74   Temp 98.1 F (36.7 C) (Oral)   Resp 14   Ht 6\' 3"  (1.905 m)   Wt 68 kg (150 lb)   SpO2 100%   BMI 18.75 kg/m   Vital signs normal    Physical Exam  Constitutional: He is oriented to person, place, and time. He appears well-developed and well-nourished. No distress.  HENT:  Head: Normocephalic and atraumatic.  Right Ear: External ear normal.  Left Ear: External ear normal.  Nose: Nose normal.  Eyes: Conjunctivae and EOM are normal.  Neck: Normal range of motion.  Cardiovascular: Normal rate.  Pulmonary/Chest: Effort normal. No respiratory distress.  Abdominal: Soft. He exhibits no distension and no mass. There is no tenderness. There is no rebound and no guarding.  Musculoskeletal: Normal range of motion.  Neurological: He is alert and oriented to person, place, and time. No cranial nerve deficit.  Skin: Skin is warm and dry. No rash noted. No erythema.  Psychiatric: He has a normal mood and affect. His behavior is normal. Thought content normal.  Nursing note and vitals reviewed.  ED Treatments / Results  Labs (all labs ordered are listed, but only abnormal results are displayed) Results for orders placed or performed during the hospital encounter of 02/01/18  Urinalysis, Routine w reflex microscopic  Result Value Ref Range   Color, Urine YELLOW YELLOW   APPearance CLEAR CLEAR   Specific Gravity, Urine 1.020 1.005 - 1.030   pH 7.0 5.0 - 8.0   Glucose, UA NEGATIVE NEGATIVE mg/dL   Hgb urine dipstick NEGATIVE NEGATIVE   Bilirubin Urine NEGATIVE NEGATIVE   Ketones, ur NEGATIVE NEGATIVE mg/dL   Protein, ur NEGATIVE NEGATIVE mg/dL   Nitrite NEGATIVE NEGATIVE   Leukocytes, UA NEGATIVE NEGATIVE   Laboratory interpretation all normal     EKG None  Radiology No results  found.  Procedures Procedures (including critical care time)  Medications Ordered in ED Medications  cefTRIAXone (ROCEPHIN) injection 250 mg (has no administration in time range)  doxycycline (VIBRA-TABS) tablet 100 mg (has no administration in time range)     Initial Impression / Assessment and Plan / ED Course  I have reviewed the triage vital signs and the nursing notes.  Pertinent labs & imaging results that were available during my care of the patient were reviewed by me and considered in my medical decision making (see chart for details).     Patient wanted to be treated for gonorrhea and chlamydia.  He also wanted to get a HIV and syphilis test done.  He was performed these tests would not come back tonight but he would be contacted or he can check in my chart to get the results.  Final Clinical Impressions(s) / ED Diagnoses   Final diagnoses:  STD (male)    ED Discharge Orders        Ordered    doxycycline (VIBRAMYCIN) 100 MG capsule  2 times daily     02/01/18 0226      Plan discharge  Devoria AlbeIva Maalle Starrett, MD, Concha PyoFACEP    Rosa Wyly, MD 02/01/18 [phone]

## 2018-02-02 LAB — HIV ANTIBODY (ROUTINE TESTING W REFLEX): HIV Screen 4th Generation wRfx: NONREACTIVE

## 2018-02-02 LAB — RPR: RPR: NONREACTIVE

## 2018-02-04 LAB — GC/CHLAMYDIA PROBE AMP (~~LOC~~) NOT AT ARMC
CHLAMYDIA, DNA PROBE: NEGATIVE
NEISSERIA GONORRHEA: NEGATIVE

## 2018-02-23 ENCOUNTER — Encounter (HOSPITAL_COMMUNITY): Payer: Self-pay

## 2018-02-23 ENCOUNTER — Emergency Department (HOSPITAL_COMMUNITY)
Admission: EM | Admit: 2018-02-23 | Discharge: 2018-02-23 | Disposition: A | Discharge status: Home / Self Care | Payer: Self-pay | Attending: Emergency Medicine | Admitting: Emergency Medicine

## 2018-02-23 ENCOUNTER — Other Ambulatory Visit: Payer: Self-pay

## 2018-02-23 DIAGNOSIS — R369 Urethral discharge, unspecified: Secondary | ICD-10-CM | POA: Insufficient documentation

## 2018-02-23 DIAGNOSIS — F17210 Nicotine dependence, cigarettes, uncomplicated: Secondary | ICD-10-CM | POA: Insufficient documentation

## 2018-02-23 DIAGNOSIS — Z202 Contact with and (suspected) exposure to infections with a predominantly sexual mode of transmission: Secondary | ICD-10-CM | POA: Insufficient documentation

## 2018-02-23 LAB — URINALYSIS, ROUTINE W REFLEX MICROSCOPIC
BACTERIA UA: NONE SEEN
Bilirubin Urine: NEGATIVE
Glucose, UA: 50 mg/dL — AB
Hgb urine dipstick: NEGATIVE
KETONES UR: NEGATIVE mg/dL
Leukocytes, UA: NEGATIVE
Nitrite: NEGATIVE
Protein, ur: 30 mg/dL — AB
Specific Gravity, Urine: 1.034 — ABNORMAL HIGH (ref 1.005–1.030)
pH: 6 (ref 5.0–8.0)

## 2018-02-23 MED ORDER — AZITHROMYCIN 250 MG PO TABS
1000.0000 mg | ORAL_TABLET | Freq: Once | ORAL | Status: AC
  Administered 2018-02-23: 1000 mg via ORAL
  Filled 2018-02-23: qty 4

## 2018-02-23 MED ORDER — LIDOCAINE HCL (PF) 1 % IJ SOLN
INTRAMUSCULAR | Status: AC
  Administered 2018-02-23: 0.9 mL
  Filled 2018-02-23: qty 2

## 2018-02-23 MED ORDER — CEFTRIAXONE SODIUM 250 MG IJ SOLR
250.0000 mg | Freq: Once | INTRAMUSCULAR | Status: AC
  Administered 2018-02-23: 250 mg via INTRAMUSCULAR
  Filled 2018-02-23: qty 250

## 2018-02-23 MED ORDER — METRONIDAZOLE 500 MG PO TABS
2000.0000 mg | ORAL_TABLET | Freq: Once | ORAL | Status: AC
  Administered 2018-02-23: 2000 mg via ORAL
  Filled 2018-02-23: qty 4

## 2018-02-23 NOTE — ED Provider Notes (Signed)
[facility] EMERGENCY DEPARTMENT Provider Note   CSN: [id number] Arrival date & time: 02/23/18  0302     History   Chief Complaint Chief Complaint  Patient presents with  . s74.5    HPI Bobby Scott is a 22 y.o. male.  Patient concerned about STDs.  States his girlfriend told him that she may have had trichomonas or something else.  He states he had penile discharge has been clear for the past week denies any pain with urination or blood in the urine.  Denies any abdominal pain, fever, testicular pain.  No chest pain or shortness of breath. Patient states she is sexually active with his girlfriend and does not use condoms.  Notably he was seen for similar symptoms on July 26 and state he got briefly better but then the discharge returned.  He was also seen in June for STD exposure as well.  The history is provided by the patient.    History reviewed. No pertinent past medical history.  There are no active problems to display for this patient.   Past Surgical History:  Procedure Laterality Date  . SKIN GRAFT          Home Medications    Prior to Admission medications   Medication Sig Start Date End Date Taking? Authorizing Provider  ciprofloxacin (CIPRO) 500 MG tablet Take 1 tablet (500 mg total) by mouth 2 (two) times daily. 11/01/17   Donnetta Hutchingook, Brian, MD  doxycycline (VIBRAMYCIN) 100 MG capsule Take 1 capsule (100 mg total) by mouth 2 (two) times daily. 02/01/18   Devoria AlbeKnapp, Iva, MD  HYDROcodone-acetaminophen (NORCO/VICODIN) 5-325 MG per tablet Take one-two tabs po q 4-6 hrs prn pain 04/02/15   Triplett, Tammy, PA-C  ibuprofen (ADVIL,MOTRIN) 600 MG tablet Take 1 tablet (600 mg total) by mouth every 6 (six) hours as needed. 11/01/17   Donnetta Hutchingook, Brian, MD    Family History No family history on file.  Social History Social History   Tobacco Use  . Smoking status: Current Some Day Smoker    Types: Cigarettes  . Smokeless tobacco: Never Used  Substance Use Topics  .  Alcohol use: Yes    Comment: rarely  . Drug use: Yes    Types: Marijuana    Comment: last used yesterday      Allergies   Bee venom and Penicillins   Review of Systems Review of Systems  Constitutional: Negative for activity change.  HENT: Negative for congestion and rhinorrhea.   Eyes: Negative for visual disturbance.  Respiratory: Negative for cough and shortness of breath.   Cardiovascular: Negative for chest pain.  Gastrointestinal: Negative for abdominal pain, nausea and vomiting.  Genitourinary: Positive for discharge. Negative for dysuria, hematuria, penile swelling and scrotal swelling.  Musculoskeletal: Negative for arthralgias and myalgias.  Skin: Negative for rash.  Neurological: Negative for dizziness, weakness and headaches.   all other systems are negative except as noted in the HPI and PMH.     Physical Exam Updated Vital Signs BP 105/65 (BP Location: Left Arm)   Pulse 64   Temp 97.9 F (36.6 C) (Oral)   Resp 18   Ht 6\' 3"  (1.905 m)   Wt 68 kg   SpO2 99%   BMI 18.75 kg/m   Physical Exam  Constitutional: He is oriented to person, place, and time. He appears well-developed and well-nourished. No distress.  HENT:  Head: Normocephalic and atraumatic.  Mouth/Throat: Oropharynx is clear and moist. No oropharyngeal exudate.  Eyes: Pupils are  equal, round, and reactive to light. Conjunctivae and EOM are normal.  Neck: Normal range of motion. Neck supple.  No meningismus.  Cardiovascular: Normal rate, regular rhythm, normal heart sounds and intact distal pulses.  No murmur heard. Pulmonary/Chest: Effort normal and breath sounds normal. No respiratory distress.  Abdominal: Soft. There is no tenderness. There is no rebound and no guarding.  Genitourinary:  Genitourinary Comments: No rashes, no testicular tenderness  Musculoskeletal: Normal range of motion. He exhibits no edema or tenderness.  Neurological: He is alert and oriented to person, place, and  time. No cranial nerve deficit. He exhibits normal muscle tone. Coordination normal.  No ataxia on finger to nose bilaterally. No pronator drift. 5/5 strength throughout. CN 2-12 intact.Equal grip strength. Sensation intact.   Skin: Skin is warm.  Psychiatric: He has a normal mood and affect. His behavior is normal.  Nursing note and vitals reviewed.    ED Treatments / Results  Labs (all labs ordered are listed, but only abnormal results are displayed) Labs Reviewed  URINALYSIS, ROUTINE W REFLEX MICROSCOPIC - Abnormal; Notable for the following components:      Result Value   Specific Gravity, Urine 1.034 (*)    Glucose, UA 50 (*)    Protein, ur 30 (*)    All other components within normal limits  URINE CULTURE    EKG None  Radiology No results found.  Procedures Procedures (including critical care time)  Medications Ordered in ED Medications  metroNIDAZOLE (FLAGYL) tablet 2,000 mg (2,000 mg Oral Given 02/23/18 0405)  cefTRIAXone (ROCEPHIN) injection 250 mg (250 mg Intramuscular Given 02/23/18 0404)  azithromycin (ZITHROMAX) tablet 1,000 mg (1,000 mg Oral Given 02/23/18 0405)  lidocaine (PF) (XYLOCAINE) 1 % injection (0.9 mLs  Given 02/23/18 0406)     Initial Impression / Assessment and Plan / ED Course  I have reviewed the triage vital signs and the nursing notes.  Pertinent labs & imaging results that were available during my care of the patient were reviewed by me and considered in my medical decision making (see chart for details).    STD exposure with penile discharge.  No fever or vomiting.  Patient requesting to be treated for trichomonas, chlamydia and gonorrhea.  Safe sex practices discussed with patient.  Follow-up with health department.  Return precautions discussed  Final Clinical Impressions(s) / ED Diagnoses   Final diagnoses:  STD exposure    ED Discharge Orders    None       Dewey Viens, Jeannett SeniorStephen, MD 02/23/18 [phone]

## 2018-02-23 NOTE — ED Triage Notes (Signed)
Pt states he wants to be checked for std's, states has been having some penile discharge.  Pt denies pain or other complaints

## 2018-02-23 NOTE — Discharge Instructions (Addendum)
Wear a condom for every sexual encounter.  You were treated for gonorrhea, chlamydia and trichomonas today.  Follow-up with the health department.  Return to the ED if you develop new or worsening symptoms.

## 2018-02-24 LAB — URINE CULTURE: Culture: NO GROWTH

## 2018-07-16 ENCOUNTER — Emergency Department (HOSPITAL_COMMUNITY)
Admission: EM | Admit: 2018-07-16 | Discharge: 2018-07-16 | Disposition: A | Discharge status: Home / Self Care | Payer: Self-pay | Attending: Emergency Medicine | Admitting: Emergency Medicine

## 2018-07-16 ENCOUNTER — Other Ambulatory Visit: Payer: Self-pay

## 2018-07-16 ENCOUNTER — Encounter (HOSPITAL_COMMUNITY): Payer: Self-pay | Admitting: Emergency Medicine

## 2018-07-16 DIAGNOSIS — R1110 Vomiting, unspecified: Secondary | ICD-10-CM | POA: Insufficient documentation

## 2018-07-16 DIAGNOSIS — Z5321 Procedure and treatment not carried out due to patient leaving prior to being seen by health care provider: Secondary | ICD-10-CM | POA: Insufficient documentation

## 2018-07-16 NOTE — ED Triage Notes (Signed)
Vomiting since 0130 last night.  Has been around family members with the same s/s

## 2018-09-22 ENCOUNTER — Encounter (HOSPITAL_COMMUNITY): Payer: Self-pay | Admitting: Emergency Medicine

## 2018-09-22 ENCOUNTER — Emergency Department (HOSPITAL_COMMUNITY)
Admission: EM | Admit: 2018-09-22 | Discharge: 2018-09-22 | Disposition: A | Discharge status: Home / Self Care | Payer: Self-pay | Attending: Emergency Medicine | Admitting: Emergency Medicine

## 2018-09-22 ENCOUNTER — Other Ambulatory Visit: Payer: Self-pay

## 2018-09-22 DIAGNOSIS — Z79899 Other long term (current) drug therapy: Secondary | ICD-10-CM | POA: Insufficient documentation

## 2018-09-22 DIAGNOSIS — K0889 Other specified disorders of teeth and supporting structures: Secondary | ICD-10-CM | POA: Insufficient documentation

## 2018-09-22 DIAGNOSIS — Z87891 Personal history of nicotine dependence: Secondary | ICD-10-CM | POA: Insufficient documentation

## 2018-09-22 MED ORDER — ACETAMINOPHEN 500 MG PO TABS
1000.0000 mg | ORAL_TABLET | Freq: Once | ORAL | Status: AC
  Administered 2018-09-22: 1000 mg via ORAL
  Filled 2018-09-22: qty 2

## 2018-09-22 MED ORDER — IBUPROFEN 800 MG PO TABS
800.0000 mg | ORAL_TABLET | Freq: Once | ORAL | Status: AC
  Administered 2018-09-22: 800 mg via ORAL
  Filled 2018-09-22: qty 1

## 2018-09-22 MED ORDER — CLINDAMYCIN HCL 150 MG PO CAPS: 300.0000 mg | ORAL_CAPSULE | Freq: Three times a day (TID) | ORAL | 0 refills | Status: DC

## 2018-09-22 MED ORDER — CLINDAMYCIN HCL 150 MG PO CAPS
300.0000 mg | ORAL_CAPSULE | Freq: Once | ORAL | Status: AC
  Administered 2018-09-22: 300 mg via ORAL
  Filled 2018-09-22: qty 2

## 2018-09-22 NOTE — ED Provider Notes (Signed)
[facility] EMERGENCY DEPARTMENT Provider Note   CSN: [id number] Arrival date & time: 09/22/18  0131    History   Chief Complaint Chief Complaint  Patient presents with  . Dental Pain    HPI Bobby Scott is a 23 y.o. male.     Patient presents to the emergency department with a chief complaint of right upper dental pain.  States that he has had pain for the past 3 days.  Denies any swelling.  Denies any fevers or chills.  He does not have a dentist.  Denies any successful treatments prior to arrival.  The history is provided by the patient. No language interpreter was used.    History reviewed. No pertinent past medical history.  There are no active problems to display for this patient.   Past Surgical History:  Procedure Laterality Date  . SKIN GRAFT          Home Medications    Prior to Admission medications   Medication Sig Start Date End Date Taking? Authorizing Provider  ciprofloxacin (CIPRO) 500 MG tablet Take 1 tablet (500 mg total) by mouth 2 (two) times daily. 11/01/17   Donnetta Hutching, MD  clindamycin (CLEOCIN) 150 MG capsule Take 2 capsules (300 mg total) by mouth 3 (three) times daily. May dispense as 150mg  capsules 09/22/18   Roxy Horseman, PA-C  doxycycline (VIBRAMYCIN) 100 MG capsule Take 1 capsule (100 mg total) by mouth 2 (two) times daily. 02/01/18   Devoria Albe, MD  HYDROcodone-acetaminophen (NORCO/VICODIN) 5-325 MG per tablet Take one-two tabs po q 4-6 hrs prn pain 04/02/15   Triplett, Tammy, PA-C  ibuprofen (ADVIL,MOTRIN) 600 MG tablet Take 1 tablet (600 mg total) by mouth every 6 (six) hours as needed. 11/01/17   Donnetta Hutching, MD    Family History No family history on file.  Social History Social History   Tobacco Use  . Smoking status: Former Smoker    Types: Cigarettes  . Smokeless tobacco: Never Used  Substance Use Topics  . Alcohol use: Yes    Comment: rarely  . Drug use: Yes    Types: Marijuana    Comment:  last used yesterday      Allergies   Bee venom and Penicillins   Review of Systems Review of Systems  All other systems reviewed and are negative.    Physical Exam Updated Vital Signs BP 130/66 (BP Location: Right Arm)   Pulse 99   Temp 98.8 F (37.1 C) (Oral)   Resp 16   SpO2 99%   Physical Exam Physical Exam  Constitutional: Pt appears well-developed and well-nourished.  HENT:  Head: Normocephalic.  Right Ear: Tympanic membrane, external ear and ear canal normal.  Left Ear: Tympanic membrane, external ear and ear canal normal.  Nose: Nose normal. Right sinus exhibits no maxillary sinus tenderness and no frontal sinus tenderness. Left sinus exhibits no maxillary sinus tenderness and no frontal sinus tenderness.  Mouth/Throat: Uvula is midline, oropharynx is clear and moist and mucous membranes are normal. No oral lesions. No uvula swelling or lacerations. No oropharyngeal exudate, posterior oropharyngeal edema, posterior oropharyngeal erythema or tonsillar abscesses.  Poor dentition No gingival swelling, fluctuance or induration No gross abscess  No sublingual edema, tenderness to palpation, or sign of Ludwig's angina, or deep space infection Pain at right upper rear molar Eyes: Conjunctivae are normal. Pupils are equal, round, and reactive to light. Right eye exhibits no discharge. Left eye exhibits no discharge.  Neck: Normal range of  motion. Neck supple.  No stridor Handling secretions without difficulty No nuchal rigidity No cervical lymphadenopathy Cardiovascular: Normal rate, regular rhythm and normal heart sounds.   Pulmonary/Chest: Effort normal. No respiratory distress.  Equal chest rise  Abdominal: Soft. Bowel sounds are normal. Pt exhibits no distension. There is no tenderness.  Lymphadenopathy: Pt has no cervical adenopathy.  Neurological: Pt is alert and oriented x 4  Skin: Skin is warm and dry.  Psychiatric: Pt has a normal mood and affect.  Nursing  note and vitals reviewed.    ED Treatments / Results  Labs (all labs ordered are listed, but only abnormal results are displayed) Labs Reviewed - No data to display  EKG None  Radiology No results found.  Procedures Procedures (including critical care time)  Medications Ordered in ED Medications  acetaminophen (TYLENOL) tablet 1,000 mg (has no administration in time range)  ibuprofen (ADVIL,MOTRIN) tablet 800 mg (has no administration in time range)  clindamycin (CLEOCIN) capsule 300 mg (has no administration in time range)     Initial Impression / Assessment and Plan / ED Course  I have reviewed the triage vital signs and the nursing notes.  Pertinent labs & imaging results that were available during my care of the patient were reviewed by me and considered in my medical decision making (see chart for details).        Patient with dentalgia.  No abscess requiring immediate incision and drainage.  Exam not concerning for Ludwig's angina or pharyngeal abscess.  Will treat with clinda. Pt instructed to follow-up with dentist.  Discussed return precautions. Pt safe for discharge.   Final Clinical Impressions(s) / ED Diagnoses   Final diagnoses:  Pain, dental    ED Discharge Orders         Ordered    clindamycin (CLEOCIN) 150 MG capsule  3 times daily     09/22/18 0329           Roxy Horseman, PA-C 09/22/18 0331    Glynn Octave, MD 09/22/18 [phone]

## 2018-09-22 NOTE — ED Triage Notes (Signed)
Pt arrives to ED pov with c/o right sided dental pain x3 days.

## 2018-11-01 ENCOUNTER — Emergency Department (HOSPITAL_COMMUNITY)
Admission: EM | Admit: 2018-11-01 | Discharge: 2018-11-01 | Disposition: A | Discharge status: Home / Self Care | Payer: Self-pay | Attending: Emergency Medicine | Admitting: Emergency Medicine

## 2018-11-01 ENCOUNTER — Encounter (HOSPITAL_COMMUNITY): Payer: Self-pay | Admitting: Emergency Medicine

## 2018-11-01 ENCOUNTER — Other Ambulatory Visit: Payer: Self-pay

## 2018-11-01 DIAGNOSIS — Z87891 Personal history of nicotine dependence: Secondary | ICD-10-CM | POA: Insufficient documentation

## 2018-11-01 DIAGNOSIS — Z79899 Other long term (current) drug therapy: Secondary | ICD-10-CM | POA: Insufficient documentation

## 2018-11-01 DIAGNOSIS — A64 Unspecified sexually transmitted disease: Secondary | ICD-10-CM | POA: Insufficient documentation

## 2018-11-01 HISTORY — DX: Unspecified sexually transmitted disease: A64

## 2018-11-01 MED ORDER — LIDOCAINE HCL (PF) 1 % IJ SOLN
INTRAMUSCULAR | Status: AC
  Filled 2018-11-01: qty 2

## 2018-11-01 MED ORDER — AZITHROMYCIN 250 MG PO TABS
1000.0000 mg | ORAL_TABLET | Freq: Once | ORAL | Status: AC
  Administered 2018-11-01: 20:00:00 1000 mg via ORAL
  Filled 2018-11-01: qty 4

## 2018-11-01 MED ORDER — CEFTRIAXONE SODIUM 250 MG IJ SOLR
250.0000 mg | Freq: Once | INTRAMUSCULAR | Status: AC
  Administered 2018-11-01: 250 mg via INTRAMUSCULAR
  Filled 2018-11-01: qty 250

## 2018-11-01 NOTE — ED Provider Notes (Signed)
[facility] EMERGENCY DEPARTMENT Provider Note   CSN: [id number] Arrival date & time: 11/01/18  1920    History   Chief Complaint Chief Complaint  Patient presents with  . Penile Discharge    HPI Bobby Scott is a 23 y.o. male.     HPI Patient presents to the ED for possible STD.  Patient states he has noticed a clear white penile discharge since yesterday.  He is denies any trouble with burning.  He denies any pain.  He denies fevers, chills.  He has not noticed any swelling.  Patient's wife is here as well to receive treatment.   Past Medical History:  Diagnosis Date  . STI (sexually transmitted infection)     There are no active problems to display for this patient.   Past Surgical History:  Procedure Laterality Date  . SKIN GRAFT          Home Medications    Prior to Admission medications   Medication Sig Start Date End Date Taking? Authorizing Provider  ciprofloxacin (CIPRO) 500 MG tablet Take 1 tablet (500 mg total) by mouth 2 (two) times daily. 11/01/17   Donnetta Hutching, MD  clindamycin (CLEOCIN) 150 MG capsule Take 2 capsules (300 mg total) by mouth 3 (three) times daily. May dispense as 150mg  capsules 09/22/18   Roxy Horseman, PA-C  doxycycline (VIBRAMYCIN) 100 MG capsule Take 1 capsule (100 mg total) by mouth 2 (two) times daily. 02/01/18   Devoria Albe, MD  HYDROcodone-acetaminophen (NORCO/VICODIN) 5-325 MG per tablet Take one-two tabs po q 4-6 hrs prn pain 04/02/15   Triplett, Tammy, PA-C  ibuprofen (ADVIL,MOTRIN) 600 MG tablet Take 1 tablet (600 mg total) by mouth every 6 (six) hours as needed. 11/01/17   Donnetta Hutching, MD    Family History History reviewed. No pertinent family history.  Social History Social History   Tobacco Use  . Smoking status: Former Smoker    Types: Cigarettes  . Smokeless tobacco: Never Used  Substance Use Topics  . Alcohol use: Yes    Comment: rarely  . Drug use: Yes    Types: Marijuana     Allergies   Bee venom  and Penicillins   Review of Systems Review of Systems  All other systems reviewed and are negative.    Physical Exam Updated Vital Signs BP 129/70 (BP Location: Left Arm)   Pulse 81   Temp 98.5 F (36.9 C) (Oral)   Resp 16   SpO2 100%   Physical Exam Vitals signs and nursing note reviewed.  Constitutional:      General: He is not in acute distress.    Appearance: He is well-developed.  HENT:     Head: Normocephalic and atraumatic.     Right Ear: External ear normal.     Left Ear: External ear normal.  Eyes:     General: No scleral icterus.       Right eye: No discharge.        Left eye: No discharge.     Conjunctiva/sclera: Conjunctivae normal.  Neck:     Musculoskeletal: Neck supple.     Trachea: No tracheal deviation.  Cardiovascular:     Rate and Rhythm: Normal rate.  Pulmonary:     Effort: Pulmonary effort is normal. No respiratory distress.     Breath sounds: No stridor.  Abdominal:     General: There is no distension.  Musculoskeletal:        General: No swelling or deformity.  Skin:  General: Skin is warm and dry.     Findings: No rash.  Neurological:     Mental Status: He is alert.     Cranial Nerves: Cranial nerve deficit: no gross deficits.      ED Treatments / Results  Labs (all labs ordered are listed, but only abnormal results are displayed) Labs Reviewed  RPR  HIV ANTIBODY (ROUTINE TESTING W REFLEX)  GC/CHLAMYDIA PROBE AMP ([place]) NOT AT [facility]     Procedures Procedures (including critical care time)  Medications Ordered in ED Medications  cefTRIAXone (ROCEPHIN) injection 250 mg (has no administration in time range)  azithromycin (ZITHROMAX) tablet 1,000 mg (has no administration in time range)     Initial Impression / Assessment and Plan / ED Course  I have reviewed the triage vital signs and the nursing notes.  Pertinent labs & imaging results that were available during my care of the patient were reviewed by me and  considered in my medical decision making (see chart for details).  Clinical Course as of Nov 01 1947  Fri Nov 01, 2018  1942 Discussed doing a urethral swab.  Patient refused this testing.  He states he has had it done before and it is very painful.  He prefers just to be treated.  I will add on a GC chlamydia probe on a urine specimen.   [JK]    Clinical Course User Index [JK] Linwood DibblesKnapp, Leea Rambeau, MD     Symptoms suggestive of possible STD.  Patient denies any other complaints.  His partner is here for treatment as well.  Will treat empirically for gonorrhea and chlamydia  Final Clinical Impressions(s) / ED Diagnoses   Final diagnoses:  STI (sexually transmitted infection)    ED Discharge Orders    None       Linwood DibblesKnapp, Waddell Iten, MD 11/01/18 1949

## 2018-11-01 NOTE — ED Triage Notes (Signed)
Pt c/o white clear penile d/c since yesterday. Denies burning with urination or genital swelling nad.

## 2018-11-01 NOTE — Discharge Instructions (Addendum)
Follow up with a primary care doctor if the symptoms do not resolve, no intercourse for 1 week after treatment

## 2018-11-03 LAB — RPR: RPR Ser Ql: NONREACTIVE

## 2018-11-03 LAB — HIV ANTIBODY (ROUTINE TESTING W REFLEX): HIV Screen 4th Generation wRfx: NONREACTIVE

## 2018-11-04 LAB — GC/CHLAMYDIA PROBE AMP (~~LOC~~) NOT AT ARMC
Chlamydia: NEGATIVE
Neisseria Gonorrhea: NEGATIVE

## 2018-11-10 ENCOUNTER — Other Ambulatory Visit: Payer: Self-pay

## 2018-11-10 ENCOUNTER — Emergency Department (HOSPITAL_COMMUNITY)
Admission: EM | Admit: 2018-11-10 | Discharge: 2018-11-10 | Disposition: A | Discharge status: Home / Self Care | Payer: Self-pay | Attending: Emergency Medicine | Admitting: Emergency Medicine

## 2018-11-10 ENCOUNTER — Encounter (HOSPITAL_COMMUNITY): Payer: Self-pay | Admitting: Emergency Medicine

## 2018-11-10 DIAGNOSIS — Z202 Contact with and (suspected) exposure to infections with a predominantly sexual mode of transmission: Secondary | ICD-10-CM | POA: Insufficient documentation

## 2018-11-10 DIAGNOSIS — Z5321 Procedure and treatment not carried out due to patient leaving prior to being seen by health care provider: Secondary | ICD-10-CM | POA: Insufficient documentation

## 2018-11-10 DIAGNOSIS — F17210 Nicotine dependence, cigarettes, uncomplicated: Secondary | ICD-10-CM | POA: Insufficient documentation

## 2018-11-10 DIAGNOSIS — N342 Other urethritis: Secondary | ICD-10-CM | POA: Insufficient documentation

## 2018-11-10 LAB — URINALYSIS, ROUTINE W REFLEX MICROSCOPIC
Bilirubin Urine: NEGATIVE
Glucose, UA: NEGATIVE mg/dL
Hgb urine dipstick: NEGATIVE
Ketones, ur: NEGATIVE mg/dL
Leukocytes,Ua: NEGATIVE
Nitrite: NEGATIVE
Protein, ur: NEGATIVE mg/dL
Specific Gravity, Urine: 1.02 (ref 1.005–1.030)
pH: 7 (ref 5.0–8.0)

## 2018-11-10 MED ORDER — LIDOCAINE HCL (PF) 1 % IJ SOLN
INTRAMUSCULAR | Status: AC
  Administered 2018-11-10: 2 mL
  Filled 2018-11-10: qty 2

## 2018-11-10 MED ORDER — AZITHROMYCIN 250 MG PO TABS
2000.0000 mg | ORAL_TABLET | Freq: Once | ORAL | Status: AC
  Administered 2018-11-10: 2000 mg via ORAL
  Filled 2018-11-10: qty 8

## 2018-11-10 MED ORDER — CEFTRIAXONE SODIUM 500 MG IJ SOLR
500.0000 mg | Freq: Once | INTRAMUSCULAR | Status: AC
  Administered 2018-11-10: 500 mg via INTRAMUSCULAR
  Filled 2018-11-10: qty 500

## 2018-11-10 NOTE — ED Triage Notes (Signed)
Patient reports being tested and treated for an STI 2 weeks ago. He reports continuing discharge from his penis. Denies pain or burning.

## 2018-11-10 NOTE — ED Notes (Signed)
Patient called for room x3 

## 2018-11-10 NOTE — ED Notes (Signed)
Patient called for room, not in waiting area or restroom.

## 2018-11-10 NOTE — Discharge Instructions (Addendum)
Follow-up with the [facility] department if symptoms do not improve.  Ultimately, you will need to have a swab taken if symptoms persist.  You have been retreated today for an infection in your urethra which is the urinary tube.

## 2018-11-10 NOTE — ED Triage Notes (Signed)
Patient treated for STI 2 weeks ago, reports he is still having clear discharge.

## 2018-11-10 NOTE — ED Notes (Signed)
Patient called a second time for room. Not present in waiting area.

## 2018-11-10 NOTE — ED Provider Notes (Signed)
[facility] EMERGENCY DEPARTMENT Provider Note   CSN: [id number] Arrival date & time: 11/10/18  1747    History   Chief Complaint Chief Complaint  Patient presents with  . SEXUALLY TRANSMITTED DISEASE    HPI Bobby Scott is a 23 y.o. male.     Patient presents with clear urethral discharge.  He was seen on 11/01/2018 Rx'd Rocephin 250 mg and Zithromax 1 g.  He refused a urethral swab at that time and is refusing a urethral swab tonight.  No fever, chills, dysuria.  Is normally healthy.     Past Medical History:  Diagnosis Date  . STI (sexually transmitted infection)     There are no active problems to display for this patient.   Past Surgical History:  Procedure Laterality Date  . SKIN GRAFT          Home Medications    Prior to Admission medications   Medication Sig Start Date End Date Taking? Authorizing Provider  ciprofloxacin (CIPRO) 500 MG tablet Take 1 tablet (500 mg total) by mouth 2 (two) times daily. 11/01/17   Donnetta Hutching, MD  clindamycin (CLEOCIN) 150 MG capsule Take 2 capsules (300 mg total) by mouth 3 (three) times daily. May dispense as 150mg  capsules 09/22/18   Roxy Horseman, PA-C  doxycycline (VIBRAMYCIN) 100 MG capsule Take 1 capsule (100 mg total) by mouth 2 (two) times daily. 02/01/18   Devoria Albe, MD  HYDROcodone-acetaminophen (NORCO/VICODIN) 5-325 MG per tablet Take one-two tabs po q 4-6 hrs prn pain 04/02/15   Triplett, Tammy, PA-C  ibuprofen (ADVIL,MOTRIN) 600 MG tablet Take 1 tablet (600 mg total) by mouth every 6 (six) hours as needed. 11/01/17   Donnetta Hutching, MD    Family History History reviewed. No pertinent family history.  Social History Social History   Tobacco Use  . Smoking status: Current Every Day Smoker    Packs/day: 0.50    Types: Cigarettes  . Smokeless tobacco: Never Used  Substance Use Topics  . Alcohol use: Yes    Comment: rarely  . Drug use: Yes    Types: Marijuana    Comment: today     Allergies   Bee  venom and Penicillins   Review of Systems Review of Systems  All other systems reviewed and are negative.    Physical Exam Updated Vital Signs BP 114/65 (BP Location: Right Arm)   Pulse 75   Temp 98.9 F (37.2 C) (Oral)   Resp 16   Ht 6\' 3"  (1.905 m)   Wt 65.7 kg   SpO2 100%   BMI 18.11 kg/m   Physical Exam Vitals signs and nursing note reviewed.  Constitutional:      Appearance: He is well-developed.  HENT:     Head: Normocephalic and atraumatic.  Eyes:     Conjunctiva/sclera: Conjunctivae normal.  Neck:     Musculoskeletal: Neck supple.  Cardiovascular:     Rate and Rhythm: Normal rate and regular rhythm.  Pulmonary:     Effort: Pulmonary effort is normal.     Breath sounds: Normal breath sounds.  Abdominal:     General: Bowel sounds are normal.     Palpations: Abdomen is soft.  Musculoskeletal: Normal range of motion.  Skin:    General: Skin is warm and dry.  Neurological:     Mental Status: He is alert and oriented to person, place, and time.  Psychiatric:        Behavior: Behavior normal.  ED Treatments / Results  Labs (all labs ordered are listed, but only abnormal results are displayed) Labs Reviewed  URINALYSIS, ROUTINE W REFLEX MICROSCOPIC    EKG None  Radiology No results found.  Procedures Procedures (including critical care time)  Medications Ordered in ED Medications - No data to display   Initial Impression / Assessment and Plan / ED Course  I have reviewed the triage vital signs and the nursing notes.  Pertinent labs & imaging results that were available during my care of the patient were reviewed by me and considered in my medical decision making (see chart for details).       Patient presents with clear urethral discharge.  He has refused a urethral swab.  Per the [employer] reference book, recommend Rocephin 500 mg IM and Zithromax 2.0 g p.o.  for failed urethritis.  Follow-up at health department.   Final  Clinical Impressions(s) / ED Diagnoses   Final diagnoses:  Urethritis    ED Discharge Orders    None       Donnetta Hutchingook, Aubreana Cornacchia, MD 11/10/18 2131

## 2019-01-04 ENCOUNTER — Other Ambulatory Visit: Payer: Self-pay

## 2019-01-04 ENCOUNTER — Encounter (HOSPITAL_COMMUNITY): Payer: Self-pay | Admitting: *Deleted

## 2019-01-04 ENCOUNTER — Emergency Department (HOSPITAL_COMMUNITY)
Admission: EM | Admit: 2019-01-04 | Discharge: 2019-01-04 | Disposition: A | Discharge status: Home / Self Care | Payer: Self-pay | Attending: Emergency Medicine | Admitting: Emergency Medicine

## 2019-01-04 DIAGNOSIS — R369 Urethral discharge, unspecified: Secondary | ICD-10-CM | POA: Insufficient documentation

## 2019-01-04 DIAGNOSIS — F17210 Nicotine dependence, cigarettes, uncomplicated: Secondary | ICD-10-CM | POA: Insufficient documentation

## 2019-01-04 LAB — URINALYSIS, ROUTINE W REFLEX MICROSCOPIC
Bacteria, UA: NONE SEEN
Glucose, UA: NEGATIVE mg/dL
Hgb urine dipstick: NEGATIVE
Ketones, ur: NEGATIVE mg/dL
Nitrite: NEGATIVE
Protein, ur: 30 mg/dL — AB
Specific Gravity, Urine: 1.031 — ABNORMAL HIGH (ref 1.005–1.030)
pH: 6 (ref 5.0–8.0)

## 2019-01-04 MED ORDER — DOXYCYCLINE HYCLATE 100 MG PO CAPS: 100.0000 mg | ORAL_CAPSULE | Freq: Two times a day (BID) | ORAL | 0 refills | Status: DC

## 2019-01-04 NOTE — ED Notes (Signed)
ED Provider at bedside. 

## 2019-01-04 NOTE — ED Provider Notes (Signed)
[facility] EMERGENCY DEPARTMENT Provider Note   CSN: [id number] Arrival date & time: 01/04/19  0020     History   Chief Complaint Chief Complaint  Patient presents with  . Penile Discharge    HPI Bobby Scott is a 23 y.o. male.     The history is provided by the patient.  Penile Discharge This is a recurrent problem. The current episode started more than 1 week ago. The problem occurs daily. The problem has been gradually worsening. Exacerbated by: urination. Nothing relieves the symptoms.    Past Medical History:  Diagnosis Date  . STI (sexually transmitted infection)     There are no active problems to display for this patient.   Past Surgical History:  Procedure Laterality Date  . SKIN GRAFT          Home Medications    Prior to Admission medications   Not on File    Family History History reviewed. No pertinent family history.  Social History Social History   Tobacco Use  . Smoking status: Current Every Day Smoker    Packs/day: 0.50    Types: Cigarettes  . Smokeless tobacco: Never Used  Substance Use Topics  . Alcohol use: Yes    Comment: rarely  . Drug use: Yes    Types: Marijuana    Comment: today     Allergies   Bee venom and Penicillins   Review of Systems Review of Systems  Constitutional: Negative for fever.  Genitourinary: Positive for discharge. Negative for testicular pain.     Physical Exam Updated Vital Signs BP 110/81 (BP Location: Left Arm)   Pulse 74   Temp 97.9 F (36.6 C) (Oral)   Resp 16   Ht 1.905 m (6\' 3" )   Wt 68 kg   SpO2 99%   BMI 18.75 kg/m   Physical Exam  CONSTITUTIONAL: Well developed/well nourished HEAD: Normocephalic/atraumatic EYES: EOMI NECK: supple no meningeal signs ABDOMEN: soft, nontender, no rebound or guarding, bowel sounds noted throughout abdomen GU:no cva tenderness, no penile discharge, no penile lesions, no scrotal tenderness, no chaperone present NEURO: Pt is  awake/alert/appropriate, moves all extremitiesx4.  No facial droop.   EXTREMITIES:  full ROM SKIN: warm, color normal PSYCH: no abnormalities of mood noted, alert and oriented to situation  ED Treatments / Results  Labs (all labs ordered are listed, but only abnormal results are displayed) Labs Reviewed  URINALYSIS, ROUTINE W REFLEX MICROSCOPIC - Abnormal; Notable for the following components:      Result Value   Specific Gravity, Urine 1.031 (*)    Bilirubin Urine SMALL (*)    Protein, ur 30 (*)    Leukocytes,Ua TRACE (*)    All other components within normal limits  GC/CHLAMYDIA PROBE AMP ([place]) NOT AT [facility]    EKG    Radiology No results found.  Procedures Procedures    Medications Ordered in ED Medications - No data to display   Initial Impression / Assessment and Plan / ED Course  I have reviewed the triage vital signs and the nursing notes.  Pertinent labs  results that were available during my care of the patient were reviewed by me and considered in my medical decision making (see chart for details).        Pt requesting doxycycline, he reports the health department told him to start it If no improvement with this ABX, will refer to urology  Final Clinical Impressions(s) / ED Diagnoses   Final diagnoses:  Penile discharge    ED Discharge Orders         Ordered    doxycycline (VIBRAMYCIN) 100 MG capsule  2 times daily     01/04/19 0134           Ripley Fraise, MD 01/04/19 [phone]

## 2019-01-04 NOTE — ED Triage Notes (Signed)
Pt c/o clear discharge from penis; pt states he was seen here a few weeks ago and is still having same complaint; pt denies any pain

## 2019-01-04 NOTE — ED Notes (Signed)
edp in prior to RN, see edp assessment for further,  

## 2019-01-07 LAB — GC/CHLAMYDIA PROBE AMP (~~LOC~~) NOT AT ARMC
Chlamydia: NEGATIVE
Neisseria Gonorrhea: NEGATIVE

## 2019-07-18 ENCOUNTER — Encounter (HOSPITAL_COMMUNITY): Payer: Self-pay | Admitting: Emergency Medicine

## 2019-07-18 ENCOUNTER — Emergency Department (HOSPITAL_COMMUNITY)
Admission: EM | Admit: 2019-07-18 | Discharge: 2019-07-18 | Disposition: A | Discharge status: Home / Self Care | Payer: Self-pay | Attending: Emergency Medicine | Admitting: Emergency Medicine

## 2019-07-18 ENCOUNTER — Other Ambulatory Visit: Payer: Self-pay

## 2019-07-18 DIAGNOSIS — Z202 Contact with and (suspected) exposure to infections with a predominantly sexual mode of transmission: Secondary | ICD-10-CM | POA: Insufficient documentation

## 2019-07-18 DIAGNOSIS — Z5321 Procedure and treatment not carried out due to patient leaving prior to being seen by health care provider: Secondary | ICD-10-CM | POA: Insufficient documentation

## 2019-07-18 NOTE — ED Triage Notes (Signed)
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

## 2019-07-27 ENCOUNTER — Encounter (HOSPITAL_COMMUNITY): Payer: Self-pay | Admitting: Emergency Medicine

## 2019-07-27 ENCOUNTER — Other Ambulatory Visit: Payer: Self-pay

## 2019-07-27 ENCOUNTER — Emergency Department (HOSPITAL_COMMUNITY)
Admission: EM | Admit: 2019-07-27 | Discharge: 2019-07-27 | Disposition: A | Discharge status: Home / Self Care | Payer: Self-pay | Attending: Emergency Medicine | Admitting: Emergency Medicine

## 2019-07-27 DIAGNOSIS — Z202 Contact with and (suspected) exposure to infections with a predominantly sexual mode of transmission: Secondary | ICD-10-CM | POA: Insufficient documentation

## 2019-07-27 DIAGNOSIS — F17210 Nicotine dependence, cigarettes, uncomplicated: Secondary | ICD-10-CM | POA: Insufficient documentation

## 2019-07-27 DIAGNOSIS — Z711 Person with feared health complaint in whom no diagnosis is made: Secondary | ICD-10-CM

## 2019-07-27 DIAGNOSIS — R369 Urethral discharge, unspecified: Secondary | ICD-10-CM

## 2019-07-27 LAB — URINALYSIS, ROUTINE W REFLEX MICROSCOPIC
Bacteria, UA: NONE SEEN
Bilirubin Urine: NEGATIVE
Glucose, UA: NEGATIVE mg/dL
Hgb urine dipstick: NEGATIVE
Ketones, ur: NEGATIVE mg/dL
Nitrite: NEGATIVE
Protein, ur: NEGATIVE mg/dL
Specific Gravity, Urine: 1.024 (ref 1.005–1.030)
pH: 7 (ref 5.0–8.0)

## 2019-07-27 MED ORDER — DOXYCYCLINE HYCLATE 100 MG PO CAPS: 100.0000 mg | ORAL_CAPSULE | Freq: Two times a day (BID) | ORAL | 0 refills | Status: DC

## 2019-07-27 MED ORDER — DOXYCYCLINE HYCLATE 100 MG PO TABS
100.0000 mg | ORAL_TABLET | Freq: Once | ORAL | Status: AC
  Administered 2019-07-27: 04:00:00 100 mg via ORAL
  Filled 2019-07-27: qty 1

## 2019-07-27 MED ORDER — CEFTRIAXONE SODIUM 500 MG IJ SOLR
500.0000 mg | Freq: Once | INTRAMUSCULAR | Status: AC
  Administered 2019-07-27: 500 mg via INTRAMUSCULAR
  Filled 2019-07-27: qty 500

## 2019-07-27 MED ORDER — METRONIDAZOLE 500 MG PO TABS
2000.0000 mg | ORAL_TABLET | Freq: Once | ORAL | Status: AC
  Administered 2019-07-27: 2000 mg via ORAL
  Filled 2019-07-27: qty 4

## 2019-07-27 NOTE — ED Triage Notes (Addendum)
Pt states his girlfriend told him that she had "Trich" and he wants to be tested for it. States he has noticed a whitish/clear discharge from his penis.

## 2019-07-27 NOTE — ED Provider Notes (Signed)
[facility] EMERGENCY DEPARTMENT Provider Note   CSN: [id number] Arrival date & time: 07/27/19  0236     History Chief Complaint  Patient presents with  . SEXUALLY TRANSMITTED DISEASE    Bobby Scott is a 24 y.o. male.  HPI     This is a 24 year old male with history of STDs who presents with concerns for trichomonas.  Patient reports that he has a sexual partner who recently tested positive for trichomonas.  He has noted some white penile discharge.  Inconsistent condom use.  He reports that this is his only sexual partner.  Denies any dysuria or fevers.  Past Medical History:  Diagnosis Date  . STI (sexually transmitted infection)     There are no problems to display for this patient.   Past Surgical History:  Procedure Laterality Date  . SKIN GRAFT         No family history on file.  Social History   Tobacco Use  . Smoking status: Current Every Day Smoker    Packs/day: 0.50    Types: Cigarettes  . Smokeless tobacco: Never Used  Substance Use Topics  . Alcohol use: Yes    Comment: rarely  . Drug use: Yes    Types: Marijuana    Comment: today    Home Medications Prior to Admission medications   Medication Sig Start Date End Date Taking? Authorizing Provider  doxycycline (VIBRAMYCIN) 100 MG capsule Take 1 capsule (100 mg total) by mouth 2 (two) times daily. One po bid x 7 days 01/04/19   Ripley Fraise, MD    Allergies    Bee venom and Penicillins  Review of Systems   Review of Systems  Constitutional: Negative for fever.  Genitourinary: Positive for discharge. Negative for dysuria, penile pain and penile swelling.  All other systems reviewed and are negative.   Physical Exam Updated Vital Signs BP 111/72   Pulse 69   Temp 97.9 F (36.6 C) (Oral)   Resp 16   Ht 1.905 m (6\' 3" )   Wt 68 kg   SpO2 99%   BMI 18.75 kg/m   Physical Exam Vitals and nursing note reviewed.  Constitutional:      Appearance: He is well-developed. He is  not ill-appearing.  HENT:     Head: Normocephalic and atraumatic.     Nose: Nose normal.     Mouth/Throat:     Mouth: Mucous membranes are moist.  Eyes:     Pupils: Pupils are equal, round, and reactive to light.  Cardiovascular:     Rate and Rhythm: Normal rate and regular rhythm.  Pulmonary:     Effort: Pulmonary effort is normal. No respiratory distress.  Musculoskeletal:     Cervical back: Neck supple.     Right lower leg: No edema.     Left lower leg: No edema.  Lymphadenopathy:     Cervical: No cervical adenopathy.  Skin:    General: Skin is warm and dry.  Neurological:     Mental Status: He is alert and oriented to person, place, and time.  Psychiatric:        Mood and Affect: Mood normal.     ED Results / Procedures / Treatments   Labs (all labs ordered are listed, but only abnormal results are displayed) Labs Reviewed  URINALYSIS, ROUTINE W REFLEX MICROSCOPIC  GC/CHLAMYDIA PROBE AMP ([place]) NOT AT [facility]    EKG None  Radiology No results found.  Procedures Procedures (including critical care time)  Medications Ordered in ED Medications  cefTRIAXone (ROCEPHIN) injection 500 mg (has no administration in time range)  doxycycline (VIBRA-TABS) tablet 100 mg (has no administration in time range)  metroNIDAZOLE (FLAGYL) tablet 2,000 mg (has no administration in time range)    ED Course  I have reviewed the triage vital signs and the nursing notes.  Pertinent labs & imaging results that were available during my care of the patient were reviewed by me and considered in my medical decision making (see chart for details).    MDM Rules/Calculators/A&P                       Patient presents with concerns for trichomonas.  No known contact.  He is overall nontoxic-appearing and vital signs are reassuring.  Reports penile discharge.  Urinalysis obtained.  Patient was tested and treated for trichomonas, GC chlamydia.  Was advised regarding safe sex practices.   He was instructed to abstain from sexual activity for the next 10 days.  After history, exam, and medical workup I feel the patient has been appropriately medically screened and is safe for discharge home. Pertinent diagnoses were discussed with the patient. Patient was given return precautions.   Final Clinical Impression(s) / ED Diagnoses Final diagnoses:  Penile discharge  Concern about STD in male without diagnosis    Rx / DC Orders ED Discharge Orders    None       Armoni Kludt, Mayer Masker, MD 07/27/19 [phone]

## 2019-07-27 NOTE — Discharge Instructions (Addendum)
You were seen today with concerns for STDs.  You were tested and treated for trichomonas and gonorrhea and chlamydia.  Abstain from sexual activity for the next 10 days.  Always use condoms.  Have all partners tested and treated.

## 2019-07-28 LAB — GC/CHLAMYDIA PROBE AMP (~~LOC~~) NOT AT ARMC
Chlamydia: NEGATIVE
Neisseria Gonorrhea: NEGATIVE

## 2019-11-24 ENCOUNTER — Other Ambulatory Visit: Payer: Self-pay

## 2019-11-24 ENCOUNTER — Emergency Department (HOSPITAL_COMMUNITY)
Admission: EM | Admit: 2019-11-24 | Discharge: 2019-11-24 | Disposition: A | Discharge status: Home / Self Care | Payer: Self-pay | Attending: Emergency Medicine | Admitting: Emergency Medicine

## 2019-11-24 ENCOUNTER — Encounter (HOSPITAL_COMMUNITY): Payer: Self-pay | Admitting: Emergency Medicine

## 2019-11-24 DIAGNOSIS — F1210 Cannabis abuse, uncomplicated: Secondary | ICD-10-CM | POA: Insufficient documentation

## 2019-11-24 DIAGNOSIS — F17210 Nicotine dependence, cigarettes, uncomplicated: Secondary | ICD-10-CM | POA: Insufficient documentation

## 2019-11-24 DIAGNOSIS — R369 Urethral discharge, unspecified: Secondary | ICD-10-CM | POA: Insufficient documentation

## 2019-11-24 LAB — HIV ANTIBODY (ROUTINE TESTING W REFLEX): HIV Screen 4th Generation wRfx: NONREACTIVE

## 2019-11-24 MED ORDER — DOXYCYCLINE HYCLATE 100 MG PO CAPS: 100.0000 mg | ORAL_CAPSULE | Freq: Two times a day (BID) | ORAL | 0 refills | Status: AC

## 2019-11-24 MED ORDER — DOXYCYCLINE HYCLATE 100 MG PO TABS
100.0000 mg | ORAL_TABLET | Freq: Once | ORAL | Status: AC
  Administered 2019-11-24: 100 mg via ORAL
  Filled 2019-11-24: qty 1

## 2019-11-24 MED ORDER — CEFTRIAXONE SODIUM 500 MG IJ SOLR
500.0000 mg | Freq: Once | INTRAMUSCULAR | Status: AC
  Administered 2019-11-24: 500 mg via INTRAMUSCULAR
  Filled 2019-11-24: qty 500

## 2019-11-24 MED ORDER — STERILE WATER FOR INJECTION IJ SOLN
INTRAMUSCULAR | Status: AC
  Administered 2019-11-24: 10 mL
  Filled 2019-11-24: qty 10

## 2019-11-24 NOTE — ED Provider Notes (Signed)
[facility] EMERGENCY DEPARTMENT Provider Note   CSN: [id number] Arrival date & time: 11/24/19  8546     History Chief Complaint  Patient presents with  . multiple complaints    Bobby Scott is a 24 y.o. male.   Penile Discharge This is a recurrent problem. The current episode started yesterday. The problem occurs constantly. The problem has not changed since onset.Pertinent negatives include no chest pain, no abdominal pain and no headaches. Nothing aggravates the symptoms. Nothing relieves the symptoms. He has tried nothing for the symptoms.       Past Medical History:  Diagnosis Date  . STI (sexually transmitted infection)     There are no problems to display for this patient.   Past Surgical History:  Procedure Laterality Date  . SKIN GRAFT         History reviewed. No pertinent family history.  Social History   Tobacco Use  . Smoking status: Current Every Day Smoker    Packs/day: 0.50    Types: Cigarettes  . Smokeless tobacco: Never Used  Substance Use Topics  . Alcohol use: Yes    Comment: rarely  . Drug use: Yes    Types: Marijuana    Comment: today    Home Medications Prior to Admission medications   Medication Sig Start Date End Date Taking? Authorizing Provider  doxycycline (VIBRAMYCIN) 100 MG capsule Take 1 capsule (100 mg total) by mouth 2 (two) times daily for 14 days. One po bid x 7 days 11/24/19 12/08/19  Samadhi Mahurin, Barbara Cower, MD    Allergies    Bee venom and Penicillins  Review of Systems   Review of Systems  Cardiovascular: Negative for chest pain.  Gastrointestinal: Negative for abdominal pain.  Genitourinary: Positive for discharge.  Neurological: Negative for headaches.  All other systems reviewed and are negative.   Physical Exam Updated Vital Signs BP 98/70 (BP Location: Right Arm)   Pulse 66   Temp 98.4 F (36.9 C) (Oral)   Resp 18   Ht 6\' 3"  (1.905 m)   Wt 65.8 kg   BMI 18.12 kg/m   Physical Exam Vitals and  nursing note reviewed.  Constitutional:      Appearance: He is well-developed.  HENT:     Head: Normocephalic and atraumatic.     Mouth/Throat:     Mouth: Mucous membranes are dry.     Pharynx: Oropharynx is clear.  Eyes:     Conjunctiva/sclera: Conjunctivae normal.  Cardiovascular:     Rate and Rhythm: Normal rate.  Pulmonary:     Effort: Pulmonary effort is normal. No respiratory distress.  Abdominal:     General: There is no distension.  Genitourinary:    Penis: Normal.   Musculoskeletal:        General: Normal range of motion.     Cervical back: Normal range of motion.  Skin:    General: Skin is warm and dry.  Neurological:     General: No focal deficit present.     Mental Status: He is alert.     ED Results / Procedures / Treatments   Labs (all labs ordered are listed, but only abnormal results are displayed) Labs Reviewed  RPR  HIV ANTIBODY (ROUTINE TESTING W REFLEX)  GC/CHLAMYDIA PROBE AMP ([place]) NOT AT [facility]    EKG None  Radiology No results found.  Procedures Procedures (including critical care time)  Medications Ordered in ED Medications  cefTRIAXone (ROCEPHIN) injection 500 mg (500 mg Intramuscular Given 11/24/19  0350)  doxycycline (VIBRA-TABS) tablet 100 mg (100 mg Oral Given 11/24/19 0350)  sterile water (preservative free) injection (10 mLs  Given 11/24/19 0351)    ED Course  I have reviewed the triage vital signs and the nursing notes.  Pertinent labs & imaging results that were available during my care of the patient were reviewed by me and considered in my medical decision making (see chart for details).    MDM Rules/Calculators/A&P                      Test and tx for std's.   Final Clinical Impression(s) / ED Diagnoses Final diagnoses:  Penile discharge    Rx / DC Orders ED Discharge Orders         Ordered    doxycycline (VIBRAMYCIN) 100 MG capsule  2 times daily     11/24/19 0343           Vinod Mikesell, Corene Cornea,  MD 11/24/19 [phone]

## 2019-11-24 NOTE — ED Triage Notes (Signed)
Patient also states that he has a boil on his right thigh that has been there  X 2 weeks.

## 2019-11-24 NOTE — ED Triage Notes (Signed)
Patient states discharge form penis x 3 days and states that he wants to be treated.

## 2019-11-25 LAB — RPR: RPR Ser Ql: NONREACTIVE

## 2020-01-01 ENCOUNTER — Emergency Department (HOSPITAL_COMMUNITY)
Admission: EM | Admit: 2020-01-01 | Discharge: 2020-01-01 | Disposition: A | Discharge status: Home / Self Care | Payer: Self-pay | Attending: Emergency Medicine | Admitting: Emergency Medicine

## 2020-01-01 ENCOUNTER — Other Ambulatory Visit: Payer: Self-pay

## 2020-01-01 ENCOUNTER — Encounter (HOSPITAL_COMMUNITY): Payer: Self-pay | Admitting: Emergency Medicine

## 2020-01-01 DIAGNOSIS — F17210 Nicotine dependence, cigarettes, uncomplicated: Secondary | ICD-10-CM | POA: Insufficient documentation

## 2020-01-01 DIAGNOSIS — R360 Urethral discharge without blood: Secondary | ICD-10-CM

## 2020-01-01 DIAGNOSIS — R369 Urethral discharge, unspecified: Secondary | ICD-10-CM | POA: Insufficient documentation

## 2020-01-01 LAB — URINALYSIS, ROUTINE W REFLEX MICROSCOPIC
Bacteria, UA: NONE SEEN
Bilirubin Urine: NEGATIVE
Glucose, UA: NEGATIVE mg/dL
Hgb urine dipstick: NEGATIVE
Ketones, ur: NEGATIVE mg/dL
Leukocytes,Ua: NEGATIVE
Nitrite: NEGATIVE
Protein, ur: 30 mg/dL — AB
Specific Gravity, Urine: 1.024 (ref 1.005–1.030)
pH: 7 (ref 5.0–8.0)

## 2020-01-01 NOTE — ED Triage Notes (Signed)
Pt C/o penile discharge. Pt was seen for same 3 weeks ago but was unable to finish his antibiotic due to going to jail. Pt requesting recheck.
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
## 2020-01-01 NOTE — Discharge Instructions (Addendum)
See the list of local family doctors below for local follow-up.  If you have a positive STD test you will have a phone call and we will call in the prescription medications for you.  Please tell your partner if you have a positive test and take all of the medication exactly as prescribed.  If you continue to have urine dripping from your penis after urination you may need to follow-up with the urologist.  I will attach their phone number above.  [facility] Primary Care Doctor List    Kari Baars MD. Specialty: Pulmonary Disease Contact information: [address]  [address]  [place] [address]  [phone]   Syliva Overman, MD. Specialty: [facility] Medicine Contact information: [address]  [place] [address]  [phone]   Lilyan Punt, MD. Specialty: [facility] Medicine Contact information: [address]  [place] [address]  [phone]   Avon Gully, MD Specialty: Internal Medicine Contact information: [address] [place] [address]  [phone]   Catalina Pizza, MD. Specialty: Internal Medicine Contact information: [address]  [place] [address]  [phone]    [facility] (Dr. Selena Batten) Specialty: Family Medicine Contact information: [address]  [place] [address]  [phone]   John Giovanni, MD. Specialty: [facility] Medicine Contact information: [address]  [address]  [place] [address]  [phone]   Carylon Perches, MD. Specialty: Internal Medicine Contact information: [address]  [address]  [place] [address]  [phone]    [facility]  [address] [place], [place] [address] [phone]  Services [facility] offers a variety of basic health services.  Services include but are not limited to: Blood pressure checks  Heart rate checks  Blood sugar checks  Urine analysis  Rapid strep  tests  Pregnancy tests.  Health education and referrals  People needing more complex services will be directed to a physician online. Using these virtual visits, doctors can evaluate and prescribe medicine and treatments. There will be no medication on-site, though [facility] will help patients fill their prescriptions at little to no cost.   For More information please go to: [email or web address]

## 2020-01-05 ENCOUNTER — Emergency Department (HOSPITAL_COMMUNITY)
Admission: EM | Admit: 2020-01-05 | Discharge: 2020-01-05 | Disposition: A | Discharge status: Home / Self Care | Payer: Self-pay | Attending: Emergency Medicine | Admitting: Emergency Medicine

## 2020-01-05 ENCOUNTER — Other Ambulatory Visit: Payer: Self-pay

## 2020-01-05 ENCOUNTER — Encounter (HOSPITAL_COMMUNITY): Payer: Self-pay | Admitting: Emergency Medicine

## 2020-01-05 DIAGNOSIS — R369 Urethral discharge, unspecified: Secondary | ICD-10-CM | POA: Insufficient documentation

## 2020-01-05 DIAGNOSIS — F17210 Nicotine dependence, cigarettes, uncomplicated: Secondary | ICD-10-CM | POA: Insufficient documentation

## 2020-01-05 LAB — GC/CHLAMYDIA PROBE AMP (~~LOC~~) NOT AT ARMC
Chlamydia: NEGATIVE
Comment: NEGATIVE
Comment: NORMAL
Neisseria Gonorrhea: NEGATIVE

## 2020-01-05 LAB — URINALYSIS, ROUTINE W REFLEX MICROSCOPIC
Bilirubin Urine: NEGATIVE
Glucose, UA: NEGATIVE mg/dL
Hgb urine dipstick: NEGATIVE
Ketones, ur: 5 mg/dL — AB
Leukocytes,Ua: NEGATIVE
Nitrite: NEGATIVE
Protein, ur: NEGATIVE mg/dL
Specific Gravity, Urine: 1.026 (ref 1.005–1.030)
pH: 6 (ref 5.0–8.0)

## 2020-01-05 MED ORDER — CEFTRIAXONE SODIUM 500 MG IJ SOLR
500.0000 mg | Freq: Once | INTRAMUSCULAR | Status: AC
  Administered 2020-01-05: 500 mg via INTRAMUSCULAR
  Filled 2020-01-05: qty 500

## 2020-01-05 MED ORDER — METRONIDAZOLE 500 MG PO TABS
2000.0000 mg | ORAL_TABLET | Freq: Once | ORAL | Status: AC
  Administered 2020-01-05: 2000 mg via ORAL
  Filled 2020-01-05: qty 4

## 2020-01-05 MED ORDER — DOXYCYCLINE HYCLATE 100 MG PO TABS
100.0000 mg | ORAL_TABLET | Freq: Once | ORAL | Status: AC
  Administered 2020-01-05: 100 mg via ORAL
  Filled 2020-01-05: qty 1

## 2020-01-05 MED ORDER — STERILE WATER FOR INJECTION IJ SOLN
INTRAMUSCULAR | Status: AC
  Filled 2020-01-05: qty 10

## 2020-01-05 MED ORDER — DOXYCYCLINE HYCLATE 100 MG PO CAPS
100.0000 mg | ORAL_CAPSULE | Freq: Two times a day (BID) | ORAL | 0 refills | Status: DC
Start: 2020-01-05 — End: 2020-11-13

## 2020-01-05 NOTE — Discharge Instructions (Signed)
You were treated for gonorrhea, chlamydia, trichomonas today.  Take the antibiotics as prescribed to complete this treatment.  Use a condom for every sexual encounter.  Follow-up with the health department.  Return to the ED with new or worsening symptoms.

## 2020-01-05 NOTE — ED Provider Notes (Signed)
[facility] EMERGENCY DEPARTMENT Provider Note   CSN: [id number] Arrival date & time: 01/05/20  4008     History Chief Complaint  Patient presents with  . S74.5    Bobby Scott is a 24 y.o. male.  Patient here with several days of watery penile discharge.  He has had some pain with urination and frequency.  No fever, chills, nausea, vomiting.  No abdominal pain or back pain.  No testicular pain.  He was seen for similar symptoms on June 24 and told he did not need antibiotics but now he wants antibiotics.  He is sexually active with 1 person.  He was treated for urethritis in May but did not complete his antibiotic course secondary to incarceration. He is still sexually active with one individual.  He has watery discharge.  He denies any fevers, chills, abdominal pain or back pain.  The history is provided by the patient.       Past Medical History:  Diagnosis Date  . STI (sexually transmitted infection)     There are no problems to display for this patient.   Past Surgical History:  Procedure Laterality Date  . SKIN GRAFT         History reviewed. No pertinent family history.  Social History   Tobacco Use  . Smoking status: Current Every Day Smoker    Packs/day: 0.50    Types: Cigarettes  . Smokeless tobacco: Never Used  Vaping Use  . Vaping Use: Never used  Substance Use Topics  . Alcohol use: Yes    Comment: rarely  . Drug use: Yes    Types: Marijuana    Comment: today    Home Medications Prior to Admission medications   Not on File    Allergies    Bee venom and Penicillins  Review of Systems   Review of Systems  Constitutional: Negative for activity change, appetite change, fatigue and fever.  HENT: Negative for congestion and rhinorrhea.   Eyes: Negative for visual disturbance.  Respiratory: Negative for cough, chest tightness and shortness of breath.   Cardiovascular: Negative for chest pain.  Gastrointestinal: Negative for  abdominal pain, nausea and vomiting.  Genitourinary: Positive for discharge, frequency and urgency. Negative for dysuria.  Musculoskeletal: Negative for arthralgias and myalgias.  Skin: Negative for rash.  Neurological: Negative for dizziness, weakness and headaches.   all other systems are negative except as noted in the HPI and PMH.    Physical Exam Updated Vital Signs BP 120/73 (BP Location: Left Arm)   Pulse 96   Temp 97.9 F (36.6 C) (Oral)   Resp 16   Ht 6\' 3"  (1.905 m)   Wt 65.8 kg   SpO2 96%   BMI 18.12 kg/m   Physical Exam Vitals and nursing note reviewed.  Constitutional:      General: He is not in acute distress.    Appearance: He is well-developed.  HENT:     Head: Normocephalic and atraumatic.     Mouth/Throat:     Pharynx: No oropharyngeal exudate.  Eyes:     Conjunctiva/sclera: Conjunctivae normal.     Pupils: Pupils are equal, round, and reactive to light.  Neck:     Comments: No meningismus. Cardiovascular:     Rate and Rhythm: Normal rate and regular rhythm.     Heart sounds: Normal heart sounds. No murmur heard.   Pulmonary:     Effort: Pulmonary effort is normal. No respiratory distress.     Breath sounds:  Normal breath sounds.  Abdominal:     Palpations: Abdomen is soft.     Tenderness: There is no abdominal tenderness. There is no guarding or rebound.  Genitourinary:    Comments: Chaperone present.  Normal external genitalia.  Watery discharge from urethra.  No testicular pain. Musculoskeletal:        General: No tenderness. Normal range of motion.     Cervical back: Normal range of motion and neck supple.  Skin:    General: Skin is warm.  Neurological:     Mental Status: He is alert and oriented to person, place, and time.     Cranial Nerves: No cranial nerve deficit.     Motor: No abnormal muscle tone.     Coordination: Coordination normal.     Comments: No ataxia on finger to nose bilaterally. No pronator drift. 5/5 strength  throughout. CN 2-12 intact.Equal grip strength. Sensation intact.   Psychiatric:        Behavior: Behavior normal.     ED Results / Procedures / Treatments   Labs (all labs ordered are listed, but only abnormal results are displayed) Labs Reviewed  URINALYSIS, ROUTINE W REFLEX MICROSCOPIC - Abnormal; Notable for the following components:      Result Value   Ketones, ur 5 (*)    All other components within normal limits  URINE CULTURE    EKG None  Radiology No results found.  Procedures Procedures (including critical care time)  Medications Ordered in ED Medications  cefTRIAXone (ROCEPHIN) injection 500 mg (has no administration in time range)  doxycycline (VIBRA-TABS) tablet 100 mg (has no administration in time range)  metroNIDAZOLE (FLAGYL) tablet 2,000 mg (has no administration in time range)    ED Course  I have reviewed the triage vital signs and the nursing notes.  Pertinent labs & imaging results that were available during my care of the patient were reviewed by me and considered in my medical decision making (see chart for details).    MDM Rules/Calculators/A&P                         Symptoms are concerning for possible trichomonas.  Has had this in the past.  Will treat empirically for trichomonas, and urethritis.  He has tolerated the Rocephin injections before despite his penicillin allergy.  History is concerning for trichomonas.  Will treat empirically for such.  Also treat empirically for GC and chlamydia.  Discussed condom use with every sexual encounter.  Urine culture pending.  Follow-up with the health department.  Discussed need sexual partners to be treated as well. Return precautions discussed Final Clinical Impression(s) / ED Diagnoses Final diagnoses:  Penile discharge    Rx / DC Orders ED Discharge Orders    None       Neiman Roots, Annie Main, MD 01/05/20 [phone]

## 2020-01-05 NOTE — ED Triage Notes (Signed)
Patient complains of penile discharge. Patient was seen for the same issue previously but states he was not given an antibiotic.

## 2020-01-07 LAB — URINE CULTURE: Culture: 40000 — AB

## 2020-01-08 NOTE — Progress Notes (Signed)
ED Antimicrobial Stewardship Positive Culture Follow Up   Bobby Scott is an 24 y.o. male who presented to [facility] on 01/05/2020 with a chief complaint of  Chief Complaint  Patient presents with   S74.5    Recent Results (from the past 720 hour(s))  Urine Culture     Status: Abnormal   Collection Time: 01/05/20  3:38 AM   Specimen: Urine, Clean Catch  Result Value Ref Range Status   Specimen Description   Final    URINE, CLEAN CATCH Performed at [facility], [address] [place], [place] [address]    Special Requests   Final    NONE Performed at [facility], [address] [place], [place] [address]    Culture (A)  Final    40,000 COLONIES/mL VIRIDANS STREPTOCOCCUS Standardized susceptibility testing for this organism is not available. Performed at [facility], [address], [place], [place] [address]    Report Status 01/07/2020 FINAL  Final   [x]  Patient discharged originally without antimicrobial agent for UTI  New antibiotic prescription: No further treatment indicated for low colony count of strep viridans. Patient already treated appropriately empirically for STD in ER.  ED Provider: , PA-C   Solon Augusta Dohlen 01/08/2020, 9:57 AM Clinical Pharmacist Monday - Friday phone -  [phone] Saturday - Sunday phone - [phone]

## 2020-01-09 ENCOUNTER — Telehealth: Payer: Self-pay | Admitting: *Deleted

## 2020-01-09 NOTE — Telephone Encounter (Signed)
Post ED Visit - Positive Culture Follow-up  Culture report reviewed by antimicrobial stewardship pharmacist: [facility] []  Nathan Batchelder, Pharm.D. []  , Pharm.D., BCPS AQ-ID []  , Pharm.D., BCPS []  Celedonio Miyamoto, Pharm.D., BCPS []  Noel, Garvin Fila.D., BCPS, AAHIVP []  , Pharm.D., BCPS, AAHIVP []  Georgina Pillion, PharmD, BCPS []  , PharmD, BCPS []  [place], PharmD, BCPS []  [address], PharmD []  , PharmD, BCPS []  Estella Husk, PharmD  Pharmacy Team []  Lysle Pearl, PharmD []  , PharmD []  Phillips Climes, PharmD []  , Rph []  Agapito Games) , PharmD []  Verlan Friends, PharmD []  , PharmD []  Mervyn Gay, PharmD []  , PharmD []  Vinnie Level, PharmD []  [facility], PharmD []  , PharmD []  Len Childs, PharmD   Positive urine culture, reviewed by [facility], PA-C Treated with Doxycycline, organism sensitive to the same and no further patient follow-up is required at this time.  Greer Pickerel [facility] 01/09/2020, 12:20 PM

## 2020-07-26 ENCOUNTER — Other Ambulatory Visit: Payer: Self-pay

## 2020-07-26 ENCOUNTER — Emergency Department (HOSPITAL_COMMUNITY): Admission: EM | Admit: 2020-07-26 | Discharge: 2020-07-26 | Discharge status: Against Medical Advice | Payer: Self-pay

## 2020-11-12 ENCOUNTER — Emergency Department (HOSPITAL_COMMUNITY): Admission: EM | Admit: 2020-11-12 | Discharge: 2020-11-12 | Discharge status: Against Medical Advice | Payer: Self-pay

## 2020-11-12 NOTE — ED Notes (Signed)
Pt called 3x no answer  

## 2020-11-13 ENCOUNTER — Emergency Department (HOSPITAL_COMMUNITY)
Admission: EM | Admit: 2020-11-13 | Discharge: 2020-11-13 | Disposition: A | Discharge status: Home / Self Care | Payer: Self-pay | Attending: Emergency Medicine | Admitting: Emergency Medicine

## 2020-11-13 ENCOUNTER — Other Ambulatory Visit: Payer: Self-pay

## 2020-11-13 ENCOUNTER — Encounter (HOSPITAL_COMMUNITY): Payer: Self-pay | Admitting: Emergency Medicine

## 2020-11-13 DIAGNOSIS — F17210 Nicotine dependence, cigarettes, uncomplicated: Secondary | ICD-10-CM | POA: Insufficient documentation

## 2020-11-13 DIAGNOSIS — L0291 Cutaneous abscess, unspecified: Secondary | ICD-10-CM

## 2020-11-13 DIAGNOSIS — L02415 Cutaneous abscess of right lower limb: Secondary | ICD-10-CM | POA: Insufficient documentation

## 2020-11-13 MED ORDER — DOXYCYCLINE HYCLATE 100 MG PO CAPS
100.0000 mg | ORAL_CAPSULE | Freq: Two times a day (BID) | ORAL | 0 refills | Status: DC
Start: 2020-11-13 — End: 2020-12-05

## 2020-11-13 MED ORDER — LIDOCAINE HCL (PF) 1 % IJ SOLN: 5.0000 mL | Freq: Once | INTRAMUSCULAR | Status: AC

## 2020-11-13 MED ORDER — LIDOCAINE-EPINEPHRINE (PF) 2 %-1:200000 IJ SOLN
10.0000 mL | Freq: Once | INTRAMUSCULAR | Status: DC
  Filled 2020-11-13: qty 10

## 2020-11-13 MED ORDER — LIDOCAINE HCL (PF) 1 % IJ SOLN
INTRAMUSCULAR | Status: AC
  Administered 2020-11-13: 5 mL via INTRADERMAL
  Filled 2020-11-13: qty 5

## 2020-11-13 MED ORDER — DOXYCYCLINE HYCLATE 100 MG PO TABS
100.0000 mg | ORAL_TABLET | Freq: Once | ORAL | Status: AC
  Administered 2020-11-13: 100 mg via ORAL
  Filled 2020-11-13: qty 1

## 2020-11-13 MED ORDER — OXYCODONE-ACETAMINOPHEN 5-325 MG PO TABS
1.0000 | ORAL_TABLET | Freq: Once | ORAL | Status: AC
  Administered 2020-11-13: 1 via ORAL
  Filled 2020-11-13: qty 1

## 2020-11-13 NOTE — ED Triage Notes (Signed)
Patient c/o abscess in right groin that started x2 days ago and is progressively getting bigger and more painful. Patient also reports smaller on in left groin. Denies any drainage of either. Denies any fevers.

## 2020-11-13 NOTE — Discharge Instructions (Addendum)
You were seen in the ER today for your skin infection on the right inner thigh.  A small incision was made over the area of infection to allow the infected fluid to drain from the wound. It will continue to do so over the next few days. You have been prescribed an antibiotic called doxycycline to take twice a day for the next 10 days at home to treat the infection and prevent any further infection from developing in these area.  Please take the entire antibiotic as prescribed for the entire course.  Please be aware that this antibiotic may make you more sensitive to the sun.  Return to the ER if you develop any fevers, chills, nausea or vomiting, worsening of the swelling, redness, or drainage from the wound despite antibiotic therapy for greater than 24 hours. Please continue to do hot compresses with a hot towel or sitting in a warm bath.  Please follow-up with your primary care doctor.
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
## 2020-11-28 ENCOUNTER — Encounter (HOSPITAL_COMMUNITY): Payer: Self-pay

## 2020-11-28 ENCOUNTER — Emergency Department (HOSPITAL_COMMUNITY)
Admission: EM | Admit: 2020-11-28 | Discharge: 2020-11-28 | Disposition: A | Discharge status: Home / Self Care | Payer: Self-pay | Attending: Emergency Medicine | Admitting: Emergency Medicine

## 2020-11-28 ENCOUNTER — Other Ambulatory Visit: Payer: Self-pay

## 2020-11-28 DIAGNOSIS — F17210 Nicotine dependence, cigarettes, uncomplicated: Secondary | ICD-10-CM | POA: Insufficient documentation

## 2020-11-28 DIAGNOSIS — Z202 Contact with and (suspected) exposure to infections with a predominantly sexual mode of transmission: Secondary | ICD-10-CM | POA: Insufficient documentation

## 2020-11-28 LAB — URINALYSIS, ROUTINE W REFLEX MICROSCOPIC
Bacteria, UA: NONE SEEN
Bilirubin Urine: NEGATIVE
Glucose, UA: NEGATIVE mg/dL
Hgb urine dipstick: NEGATIVE
Ketones, ur: NEGATIVE mg/dL
Leukocytes,Ua: NEGATIVE
Nitrite: NEGATIVE
Protein, ur: 30 mg/dL — AB
Specific Gravity, Urine: 1.027 (ref 1.005–1.030)
pH: 5 (ref 5.0–8.0)

## 2020-11-28 NOTE — ED Triage Notes (Signed)
Pt says he wants to be checked for STD, says his girlfriend is here now and she is being checked. Pt denies any symptoms.

## 2020-11-28 NOTE — Discharge Instructions (Addendum)
We will call you if your test results indicate you need further treatment or need to take additional action.  Return to the emergency department if symptoms significantly worsen or change.

## 2020-11-28 NOTE — ED Provider Notes (Signed)
[facility] EMERGENCY DEPARTMENT Provider Note   CSN: [id number] Arrival date & time: 11/28/20  4259     History Chief Complaint  Patient presents with  . Exposure to STD    Bobby Scott is a 25 y.o. male.  Patient is a 25 year old male presenting for STD screening.  Patient here with girlfriend who is pregnant and has a history of herpes.  His girlfriend has been experiencing vaginal burning and he feels as though he should be checked out although he is having no symptoms.  He denies dysuria, urethral discharge, rash, lesions on his genitalia, or other issues.  The history is provided by the patient.       Past Medical History:  Diagnosis Date  . STI (sexually transmitted infection)     There are no problems to display for this patient.   Past Surgical History:  Procedure Laterality Date  . SKIN GRAFT         No family history on file.  Social History   Tobacco Use  . Smoking status: Current Every Day Smoker    Packs/day: 0.50    Types: Cigarettes  . Smokeless tobacco: Never Used  Vaping Use  . Vaping Use: Never used  Substance Use Topics  . Alcohol use: Yes    Comment: rarely  . Drug use: Yes    Types: Marijuana    Comment: today    Home Medications Prior to Admission medications   Medication Sig Start Date End Date Taking? Authorizing Provider  doxycycline (VIBRAMYCIN) 100 MG capsule Take 1 capsule (100 mg total) by mouth 2 (two) times daily. 11/13/20   Sponseller, Eugene Gavia, PA-C    Allergies    Bee venom and Penicillins  Review of Systems   Review of Systems  All other systems reviewed and are negative.   Physical Exam Updated Vital Signs BP 111/72   Pulse 75   Temp 98.1 F (36.7 C) (Oral)   Resp 17   Wt 65.8 kg   SpO2 98%   BMI 18.13 kg/m   Physical Exam Vitals and nursing note reviewed.  Constitutional:      General: He is not in acute distress.    Appearance: Normal appearance. He is not ill-appearing.  HENT:      Head: Normocephalic and atraumatic.  Pulmonary:     Effort: Pulmonary effort is normal.  Genitourinary:    Penis: Normal.      Testes: Normal.     Comments: There is no rash, no blisters, no urethral discharge, or any abnormality noted with the genitalia. Skin:    General: Skin is warm and dry.  Neurological:     Mental Status: He is alert.     ED Results / Procedures / Treatments   Labs (all labs ordered are listed, but only abnormal results are displayed) Labs Reviewed  URINALYSIS, ROUTINE W REFLEX MICROSCOPIC  GC/CHLAMYDIA PROBE AMP ([place]) NOT AT [facility]    EKG None  Radiology No results found.  Procedures Procedures    Medications Ordered in ED Medications - No data to display  ED Course  I have reviewed the triage vital signs and the nursing notes.  Pertinent labs & imaging results that were available during my care of the patient were reviewed by me and considered in my medical decision making (see chart for details).    MDM Rules/Calculators/A&P  Urinalysis negative for infection.  GC and Chlamydia are pending.  We will forego treatment pending results as patient  is having no symptoms.  Final Clinical Impression(s) / ED Diagnoses Final diagnoses:  None    Rx / DC Orders ED Discharge Orders    None       Geoffery Lyons, MD 11/28/20 [phone]

## 2020-11-29 LAB — GC/CHLAMYDIA PROBE AMP (~~LOC~~) NOT AT ARMC
Chlamydia: NEGATIVE
Comment: NEGATIVE
Comment: NORMAL
Neisseria Gonorrhea: NEGATIVE

## 2020-12-05 ENCOUNTER — Emergency Department (HOSPITAL_COMMUNITY): Payer: Self-pay

## 2020-12-05 ENCOUNTER — Encounter (HOSPITAL_COMMUNITY): Payer: Self-pay | Admitting: Emergency Medicine

## 2020-12-05 ENCOUNTER — Other Ambulatory Visit: Payer: Self-pay

## 2020-12-05 ENCOUNTER — Emergency Department (HOSPITAL_COMMUNITY)
Admission: EM | Admit: 2020-12-05 | Discharge: 2020-12-05 | Disposition: A | Discharge status: Home / Self Care | Payer: Self-pay | Attending: Emergency Medicine | Admitting: Emergency Medicine

## 2020-12-05 DIAGNOSIS — F17210 Nicotine dependence, cigarettes, uncomplicated: Secondary | ICD-10-CM | POA: Insufficient documentation

## 2020-12-05 DIAGNOSIS — N50819 Testicular pain, unspecified: Secondary | ICD-10-CM

## 2020-12-05 DIAGNOSIS — N453 Epididymo-orchitis: Secondary | ICD-10-CM | POA: Insufficient documentation

## 2020-12-05 DIAGNOSIS — M549 Dorsalgia, unspecified: Secondary | ICD-10-CM | POA: Insufficient documentation

## 2020-12-05 DIAGNOSIS — N50811 Right testicular pain: Secondary | ICD-10-CM | POA: Insufficient documentation

## 2020-12-05 LAB — CBC WITH DIFFERENTIAL/PLATELET
Abs Immature Granulocytes: 0.01 10*3/uL (ref 0.00–0.07)
Basophils Absolute: 0.1 10*3/uL (ref 0.0–0.1)
Basophils Relative: 1 %
Eosinophils Absolute: 0.3 10*3/uL (ref 0.0–0.5)
Eosinophils Relative: 4 %
HCT: 42.5 % (ref 39.0–52.0)
Hemoglobin: 13.6 g/dL (ref 13.0–17.0)
Immature Granulocytes: 0 %
Lymphocytes Relative: 57 %
Lymphs Abs: 4.4 10*3/uL — ABNORMAL HIGH (ref 0.7–4.0)
MCH: 29.1 pg (ref 26.0–34.0)
MCHC: 32 g/dL (ref 30.0–36.0)
MCV: 90.8 fL (ref 80.0–100.0)
Monocytes Absolute: 0.4 10*3/uL (ref 0.1–1.0)
Monocytes Relative: 5 %
Neutro Abs: 2.6 10*3/uL (ref 1.7–7.7)
Neutrophils Relative %: 33 %
Platelets: 169 10*3/uL (ref 150–400)
RBC: 4.68 MIL/uL (ref 4.22–5.81)
RDW: 13.2 % (ref 11.5–15.5)
WBC: 7.8 10*3/uL (ref 4.0–10.5)
nRBC: 0 % (ref 0.0–0.2)

## 2020-12-05 LAB — URINALYSIS, ROUTINE W REFLEX MICROSCOPIC
Bilirubin Urine: NEGATIVE
Glucose, UA: NEGATIVE mg/dL
Hgb urine dipstick: NEGATIVE
Ketones, ur: NEGATIVE mg/dL
Leukocytes,Ua: NEGATIVE
Nitrite: NEGATIVE
Protein, ur: NEGATIVE mg/dL
Specific Gravity, Urine: 1.012 (ref 1.005–1.030)
pH: 7 (ref 5.0–8.0)

## 2020-12-05 LAB — BASIC METABOLIC PANEL
Anion gap: 6 (ref 5–15)
BUN: 12 mg/dL (ref 6–20)
CO2: 29 mmol/L (ref 22–32)
Calcium: 8.6 mg/dL — ABNORMAL LOW (ref 8.9–10.3)
Chloride: 101 mmol/L (ref 98–111)
Creatinine, Ser: 0.99 mg/dL (ref 0.61–1.24)
GFR, Estimated: >60 mL/min (ref >60)
Glucose, Bld: 88 mg/dL (ref 70–99)
Potassium: 3.9 mmol/L (ref 3.5–5.1)
Sodium: 136 mmol/L (ref 135–145)

## 2020-12-05 MED ORDER — IBUPROFEN 400 MG PO TABS
400.0000 mg | ORAL_TABLET | Freq: Once | ORAL | Status: AC
  Administered 2020-12-05: 400 mg via ORAL
  Filled 2020-12-05: qty 1

## 2020-12-05 MED ORDER — DOXYCYCLINE HYCLATE 100 MG PO TABS
100.0000 mg | ORAL_TABLET | Freq: Once | ORAL | Status: AC
  Administered 2020-12-05: 100 mg via ORAL
  Filled 2020-12-05: qty 1

## 2020-12-05 MED ORDER — CEFTRIAXONE SODIUM 500 MG IJ SOLR
500.0000 mg | Freq: Once | INTRAMUSCULAR | Status: AC
  Administered 2020-12-05: 500 mg via INTRAMUSCULAR
  Filled 2020-12-05: qty 500

## 2020-12-05 MED ORDER — DOXYCYCLINE HYCLATE 100 MG PO CAPS
100.0000 mg | ORAL_CAPSULE | Freq: Two times a day (BID) | ORAL | 0 refills | Status: DC
Start: 2020-12-05 — End: 2021-08-21

## 2020-12-05 NOTE — Discharge Instructions (Signed)
Your testing today shows inflammation of your testicle called orchitis.  This is likely sexually transmitted infection.  Your sexual partner should be treated as well.  Make sure to use a condom with every sexual encounter. Return to the ED with new or worsening symptoms.

## 2020-12-05 NOTE — ED Provider Notes (Signed)
[facility] EMERGENCY DEPARTMENT Provider Note   CSN: [id number] Arrival date & time: 12/05/20  0205     History Chief Complaint  Patient presents with  . Abdominal Pain    Bobby Scott is a 25 y.o. male.  Patient with history of frequent STDs presenting with a 3-day history of lower abdominal pain, low back pain and right testicular pain.  States the pain is progressively worsening.  Has not tried anything for it at home.  Pain is in the center of his stomach which he thinks is his bladder.  Has dull pain going across his low back.  Denies any fall or injury.  Denies any radiation of the pain down his legs or buttocks.  In the past day he has developed pain going to his right testicle and is very sore to palpation.  He denies any pain with urination or blood in the urine.  Denies any urethral discharge.  He was seen in the ED on May 22 and had negative GC and Chlamydia testings at that point.  He visits the ED quite often with urethritis type symptoms.  He feels increasing pain and swelling in his right testicle is worse when he tries to walk. Denies fevers, chills, nausea or vomiting.  Denies any constipation or diarrhea.  Still eating and drinking well.  The history is provided by the patient.  Abdominal Pain Associated symptoms: no chest pain, no cough, no dysuria, no fever, no hematuria, no nausea and no vomiting        Past Medical History:  Diagnosis Date  . STI (sexually transmitted infection)     There are no problems to display for this patient.   Past Surgical History:  Procedure Laterality Date  . SKIN GRAFT         History reviewed. No pertinent family history.  Social History   Tobacco Use  . Smoking status: Current Every Day Smoker    Packs/day: 0.50    Types: Cigarettes  . Smokeless tobacco: Never Used  Vaping Use  . Vaping Use: Never used  Substance Use Topics  . Alcohol use: Yes    Comment: rarely  . Drug use: Yes    Types: Marijuana     Comment: today    Home Medications Prior to Admission medications   Medication Sig Start Date End Date Taking? Authorizing Provider  doxycycline (VIBRAMYCIN) 100 MG capsule Take 1 capsule (100 mg total) by mouth 2 (two) times daily. 11/13/20   Sponseller, Eugene Gavia, PA-C    Allergies    Bee venom and Penicillins  Review of Systems   Review of Systems  Constitutional: Negative for activity change, appetite change and fever.  HENT: Negative for congestion and rhinorrhea.   Respiratory: Negative for cough and chest tightness.   Cardiovascular: Negative for chest pain.  Gastrointestinal: Positive for abdominal pain. Negative for nausea and vomiting.  Genitourinary: Positive for scrotal swelling and testicular pain. Negative for dysuria, flank pain, frequency, hematuria and penile discharge.  Musculoskeletal: Positive for back pain.  Neurological: Negative for dizziness, weakness and headaches.   all other systems are negative except as noted in the HPI and PMH.    Physical Exam Updated Vital Signs BP 112/70 (BP Location: Left Arm)   Pulse 61   Temp 98.3 F (36.8 C) (Oral)   Resp 16   Ht 6\' 3"  (1.905 m)   Wt 65.8 kg   SpO2 100%   BMI 18.13 kg/m   Physical Exam Vitals and  nursing note reviewed.  Constitutional:      General: He is not in acute distress.    Appearance: He is well-developed.  HENT:     Head: Normocephalic and atraumatic.     Mouth/Throat:     Pharynx: No oropharyngeal exudate.  Eyes:     Conjunctiva/sclera: Conjunctivae normal.     Pupils: Pupils are equal, round, and reactive to light.  Neck:     Comments: No meningismus. Cardiovascular:     Rate and Rhythm: Normal rate and regular rhythm.     Heart sounds: Normal heart sounds. No murmur heard.   Pulmonary:     Effort: Pulmonary effort is normal. No respiratory distress.     Breath sounds: Normal breath sounds.  Abdominal:     Palpations: Abdomen is soft.     Tenderness: There is abdominal  tenderness. There is no guarding or rebound.     Comments: Periumbilical tenderness, no guarding or rebound  Genitourinary:    Comments: Penis appears normal to inspection.  The right testicle is tender to palpation.  Size is symmetric and lie is normal.  There is no surrounding erythema or induration. Musculoskeletal:        General: No tenderness. Normal range of motion.     Cervical back: Normal range of motion and neck supple.  Skin:    General: Skin is warm.     Capillary Refill: Capillary refill takes less than 2 seconds.  Neurological:     General: No focal deficit present.     Mental Status: He is alert and oriented to person, place, and time. Mental status is at baseline.     Cranial Nerves: No cranial nerve deficit.     Motor: No abnormal muscle tone.     Coordination: Coordination normal.     Comments: No ataxia on finger to nose bilaterally. No pronator drift. 5/5 strength throughout. CN 2-12 intact.Equal grip strength. Sensation intact.   Psychiatric:        Behavior: Behavior normal.     ED Results / Procedures / Treatments   Labs (all labs ordered are listed, but only abnormal results are displayed) Labs Reviewed  CBC WITH DIFFERENTIAL/PLATELET - Abnormal; Notable for the following components:      Result Value   Lymphs Abs 4.4 (*)    All other components within normal limits  BASIC METABOLIC PANEL - Abnormal; Notable for the following components:   Calcium 8.6 (*)    All other components within normal limits  URINE CULTURE  URINALYSIS, ROUTINE W REFLEX MICROSCOPIC  GC/CHLAMYDIA PROBE AMP ([place]) NOT AT [facility]    EKG None  Radiology CT Renal Stone Study  Result Date: 12/05/2020 CLINICAL DATA:  Lower back pain sharp abdominal lower pain EXAM: CT ABDOMEN AND PELVIS WITHOUT CONTRAST TECHNIQUE: Multidetector CT imaging of the abdomen and pelvis was performed following the standard protocol without IV contrast. COMPARISON:  CT 11/01/2017 FINDINGS: Lower  chest: Lung bases demonstrate no acute consolidation or effusion. Normal cardiac size Hepatobiliary: No focal liver abnormality is seen. No gallstones, gallbladder wall thickening, or biliary dilatation. Pancreas: Unremarkable. No pancreatic ductal dilatation or surrounding inflammatory changes. Spleen: Normal in size without focal abnormality. Adrenals/Urinary Tract: Adrenal glands are unremarkable. Kidneys are normal, without renal calculi, focal lesion, or hydronephrosis. Bladder is slightly thick walled but under distended. Stomach/Bowel: Stomach is within normal limits. Appendix appears normal. No evidence of bowel wall thickening, distention, or inflammatory changes. Vascular/Lymphatic: No significant vascular findings are present. No enlarged  abdominal or pelvic lymph nodes. Reproductive: Prostate is unremarkable. Other: No abdominal wall hernia or abnormality. No abdominopelvic ascites. Musculoskeletal: No acute or significant osseous findings. IMPRESSION: No CT evidence for acute intra-abdominal or pelvic abnormality. Electronically Signed   By: Jasmine Pang M.D.   On: 12/05/2020 03:10   US SCROTUM W/DOPPLER  Result Date: 12/05/2020 CLINICAL DATA:  Right testicular pain for 4 days, swelling EXAM: SCROTAL ULTRASOUND DOPPLER ULTRASOUND OF THE TESTICLES TECHNIQUE: Complete ultrasound examination of the testicles, epididymis, and other scrotal structures was performed. Color and spectral Doppler ultrasound were also utilized to evaluate blood flow to the testicles. COMPARISON:  12/05/2020, 11/01/2017 FINDINGS: Right testicle Measurements: 3.1 x 5.1 x 2.3 cm. The right testicle demonstrates mild increased vascularity relative to the left, which could reflect orchitis. No focal abnormality. Left testicle Measurements: 3.3 x 3.0 x 2.9 cm. No mass or microlithiasis visualized. Right epididymis:  Normal in size and appearance. Left epididymis:  Normal in size and appearance. Hydrocele:  None visualized.  Varicocele:  None visualized. Pulsed Doppler interrogation of both testes demonstrates normal low resistance arterial and venous waveforms bilaterally. IMPRESSION: 1. Mild increased vascularity of the right testicle relative to the left, which could reflect underlying orchitis. 2. Otherwise unremarkable testicular ultrasound. No sonographic evidence of torsion. please pick correct template. Electronically Signed   By: Sharlet Salina M.D.   On: 12/05/2020 03:54    Procedures Procedures   Medications Ordered in ED Medications - No data to display  ED Course  I have reviewed the triage vital signs and the nursing notes.  Pertinent labs & imaging results that were available during my care of the patient were reviewed by me and considered in my medical decision making (see chart for details).    MDM Rules/Calculators/A&P                          Lower abdominal pain and back pain with new onset testicular pain over the past day.  Denies urinary symptoms and fevers.  Urinalysis is negative.  Patient with new lower abdominal pain and flank pain and back pain will evaluate for possible kidney stone.  Pain radiates to his right testicle which is quite tender.  CT scan is negative for kidney stone or other acute pathology. We will proceed with imaging of testicle to rule out torsion.  No testicular torsion.  Does have evidence of orchitis on the right side.  Will treat with antibiotics empirically for suspected gonorrhea or chlamydia. Discussed with patient using protection for every sexual encounter. Discussed having his partner treated as well.  Return to the ED with worsening symptoms. Final Clinical Impression(s) / ED Diagnoses Final diagnoses:  Testicular pain  Orchitis and epididymitis    Rx / DC Orders ED Discharge Orders    None       Kamir Selover, Jeannett Senior, MD 12/05/20 [phone]

## 2020-12-05 NOTE — ED Notes (Signed)
US at bedside

## 2020-12-05 NOTE — ED Triage Notes (Addendum)
Pt with c/o lower abdominal pain and back pain x 3-4 days. Denies any urinary symptoms. Pt also c/o pain to R scrotal area and states "it feels swollen".

## 2020-12-07 LAB — GC/CHLAMYDIA PROBE AMP (~~LOC~~) NOT AT ARMC
Chlamydia: NEGATIVE
Comment: NEGATIVE
Comment: NORMAL
Neisseria Gonorrhea: NEGATIVE

## 2020-12-07 LAB — URINE CULTURE: Culture: NO GROWTH

## 2021-06-30 ENCOUNTER — Emergency Department (HOSPITAL_COMMUNITY)
Admission: EM | Admit: 2021-06-30 | Discharge: 2021-06-30 | Disposition: A | Discharge status: Home / Self Care | Payer: Self-pay | Attending: Emergency Medicine | Admitting: Emergency Medicine

## 2021-06-30 ENCOUNTER — Encounter (HOSPITAL_COMMUNITY): Payer: Self-pay | Admitting: Emergency Medicine

## 2021-06-30 DIAGNOSIS — Z202 Contact with and (suspected) exposure to infections with a predominantly sexual mode of transmission: Secondary | ICD-10-CM | POA: Insufficient documentation

## 2021-06-30 DIAGNOSIS — F17210 Nicotine dependence, cigarettes, uncomplicated: Secondary | ICD-10-CM | POA: Insufficient documentation

## 2021-06-30 MED ORDER — METRONIDAZOLE 500 MG PO TABS
2000.0000 mg | ORAL_TABLET | Freq: Once | ORAL | Status: AC
  Administered 2021-06-30: 03:00:00 2000 mg via ORAL
  Filled 2021-06-30: qty 4

## 2021-06-30 NOTE — ED Notes (Signed)
Pt unable to get urine specimen at this time, given water per request.

## 2021-06-30 NOTE — Discharge Instructions (Signed)
You were evaluated in the Emergency Department and after careful evaluation, we did not find any emergent condition requiring admission or further testing in the hospital.  Your exam/testing today was overall reassuring.  We have treated you for exposure to trichomonas here in the emergency department.  Please return to the Emergency Department if you experience any worsening of your condition.  Thank you for allowing [place] to be a part of your care.

## 2021-06-30 NOTE — ED Provider Notes (Signed)
[facility]-EMERGENCY DEPT [facility] Emergency Department Provider Note MRN:  [id number]  Arrival date & time: 06/30/21     Chief Complaint   Exposure to STD   History of Present Illness   Bobby Scott is a 25 y.o. year-old male with no pertinent past medical history presenting to the ED with chief complaint of exposure to STD.  Patient is without symptoms.  Was informed by sexual partner that he was exposed to trichomonas.  Here for testing or treatment.  Denies burning with urination, no penile discharge, no swelling, no fever.  Review of Systems  A problem-focused ROS was performed. Positive for STD exposure.  Patient denies burning with urination.  Patient's Health History    Past Medical History:  Diagnosis Date   STI (sexually transmitted infection)     Past Surgical History:  Procedure Laterality Date   SKIN GRAFT      History reviewed. No pertinent family history.  Social History   Socioeconomic History   Marital status: Single    Spouse name: Not on file   Number of children: Not on file   Years of education: Not on file   Highest education level: Not on file  Occupational History   Not on file  Tobacco Use   Smoking status: Every Day    Packs/day: 0.50    Types: Cigarettes   Smokeless tobacco: Never  Vaping Use   Vaping Use: Never used  Substance and Sexual Activity   Alcohol use: Yes    Comment: rarely   Drug use: Yes    Types: Marijuana    Comment: today   Sexual activity: Yes    Birth control/protection: Condom  Other Topics Concern   Not on file  Social History Narrative   Not on file   Social Determinants of Health   Financial Resource Strain: Not on file  Food Insecurity: Not on file  Transportation Needs: Not on file  Physical Activity: Not on file  Stress: Not on file  Social Connections: Not on file  Intimate Partner Violence: Not on file     Physical Exam   Vitals:   06/30/21 0105  BP: 129/79  Pulse: 70  Resp: 16   Temp: 97.9 F (36.6 C)  SpO2: 100%    CONSTITUTIONAL: Well-appearing, NAD NEURO:  Alert and oriented x 3, no focal deficits EYES:  eyes equal and reactive ENT/NECK:  no LAD, no JVD CARDIO: Regular rate, well-perfused PULM: No increased work of breathing GI/GU: Nondistended MSK/SPINE:  No gross deformities, no edema SKIN:  no rash, atraumatic PSYCH:  Appropriate speech and behavior  *Additional and/or pertinent findings included in MDM below  Diagnostic and Interventional Summary    EKG Interpretation  Date/Time:    Ventricular Rate:    PR Interval:    QRS Duration:   QT Interval:    QTC Calculation:   R Axis:     Text Interpretation:         Labs Reviewed  GC/CHLAMYDIA PROBE AMP ([place]) NOT AT [facility]    No orders to display    Medications  metroNIDAZOLE (FLAGYL) tablet 2,000 mg (has no administration in time range)     Procedures  /  Critical Care Procedures  ED Course and Medical Decision Making  I have reviewed the triage vital signs, the nursing notes, and pertinent available records from the EMR.  Listed above are laboratory and imaging tests that I personally ordered, reviewed, and interpreted and then considered in my medical  decision making (see below for details).  We will treat for trichomonas exposure with single dose Flagyl.  He is not interested in testing or treatment for other STDs at this time.       Elmer Sow. Pilar Plate, MD [facility] Emergency Medicine [facility] [email or web address]  Final Clinical Impressions(s) / ED Diagnoses     ICD-10-CM   1. Exposure to STD  Z20.2       ED Discharge Orders     None        Discharge Instructions Discussed with and Provided to Patient:    Discharge Instructions      You were evaluated in the Emergency Department and after careful evaluation, we did not find any emergent condition requiring admission or further testing in the hospital.  Your exam/testing today was  overall reassuring.  We have treated you for exposure to trichomonas here in the emergency department.  Please return to the Emergency Department if you experience any worsening of your condition.  Thank you for allowing [place] to be a part of your care.        Sabas Sous, MD 06/30/21 [phone]

## 2021-06-30 NOTE — ED Triage Notes (Signed)
Pt states he was exposed to trich and wants to make sure he doesn't have it too.

## 2021-08-21 ENCOUNTER — Emergency Department (HOSPITAL_COMMUNITY)
Admission: EM | Admit: 2021-08-21 | Discharge: 2021-08-21 | Disposition: A | Discharge status: Home / Self Care | Payer: Self-pay | Attending: Emergency Medicine | Admitting: Emergency Medicine

## 2021-08-21 ENCOUNTER — Other Ambulatory Visit: Payer: Self-pay

## 2021-08-21 ENCOUNTER — Encounter (HOSPITAL_COMMUNITY): Payer: Self-pay

## 2021-08-21 DIAGNOSIS — F17210 Nicotine dependence, cigarettes, uncomplicated: Secondary | ICD-10-CM | POA: Insufficient documentation

## 2021-08-21 DIAGNOSIS — R369 Urethral discharge, unspecified: Secondary | ICD-10-CM | POA: Insufficient documentation

## 2021-08-21 DIAGNOSIS — R300 Dysuria: Secondary | ICD-10-CM | POA: Insufficient documentation

## 2021-08-21 MED ORDER — CEFTRIAXONE SODIUM 500 MG IJ SOLR
500.0000 mg | Freq: Once | INTRAMUSCULAR | Status: AC
  Administered 2021-08-21: 500 mg via INTRAMUSCULAR
  Filled 2021-08-21: qty 500

## 2021-08-21 MED ORDER — DOXYCYCLINE HYCLATE 100 MG PO CAPS: 100.0000 mg | ORAL_CAPSULE | Freq: Two times a day (BID) | ORAL | 0 refills | Status: AC

## 2021-08-21 NOTE — Discharge Instructions (Signed)
You were evaluated in the Emergency Department and after careful evaluation, we did not find any emergent condition requiring admission or further testing in the hospital.  Your exam/testing today was overall reassuring.  Symptoms may be due to chlamydia or gonorrhea.  We treated you for STDs here in the emergency department.  Important that you inform any sexual partners and to refrain from sexual contact for the next 2 weeks while you are on the antibiotic  Please return to the Emergency Department if you experience any worsening of your condition.  Thank you for allowing [place] to be a part of your care.

## 2021-08-21 NOTE — ED Provider Notes (Signed)
[facility] Emergency Department Provider Note MRN:  [id number]  Arrival date & time: 08/21/21     Chief Complaint   Penile discharge History of Present Illness   Bobby Scott is a 26 y.o. year-old male with a history of STI presenting to the ED with chief complaint of penile discharge.  Clear discharge and mild burning with urination for the past 2 days.  Thinks he has another STD.  Denies any fever, no other complaints.  Review of Systems  A thorough review of systems was obtained and all systems are negative except as noted in the HPI and PMH.   Patient's Health History    Past Medical History:  Diagnosis Date   STI (sexually transmitted infection)     Past Surgical History:  Procedure Laterality Date   SKIN GRAFT      History reviewed. No pertinent family history.  Social History   Socioeconomic History   Marital status: Single    Spouse name: Not on file   Number of children: Not on file   Years of education: Not on file   Highest education level: Not on file  Occupational History   Not on file  Tobacco Use   Smoking status: Every Day    Packs/day: 0.50    Types: Cigarettes   Smokeless tobacco: Never  Vaping Use   Vaping Use: Never used  Substance and Sexual Activity   Alcohol use: Yes    Comment: rarely   Drug use: Yes    Types: Marijuana    Comment: today   Sexual activity: Yes    Birth control/protection: Condom  Other Topics Concern   Not on file  Social History Narrative   Not on file   Social Determinants of Health   Financial Resource Strain: Not on file  Food Insecurity: Not on file  Transportation Needs: Not on file  Physical Activity: Not on file  Stress: Not on file  Social Connections: Not on file  Intimate Partner Violence: Not on file     Physical Exam   Vitals:   08/21/21 2311  BP: 123/79  Pulse: 73  Resp: 17  Temp: 97.9 F (36.6 C)  SpO2: 99%    CONSTITUTIONAL: Well-appearing,  NAD NEURO/PSYCH:  Alert and oriented x 3, no focal deficits EYES:  eyes equal and reactive ENT/NECK:  no LAD, no JVD CARDIO: Regular rate, well-perfused, normal S1 and S2 PULM:  CTAB no wheezing or rhonchi GI/GU:  non-distended, non-tender MSK/SPINE:  No gross deformities, no edema SKIN:  no rash, atraumatic   *Additional and/or pertinent findings included in MDM below  Diagnostic and Interventional Summary    EKG Interpretation  Date/Time:    Ventricular Rate:    PR Interval:    QRS Duration:   QT Interval:    QTC Calculation:   R Axis:     Text Interpretation:         Labs Reviewed  GC/CHLAMYDIA PROBE AMP ([place]) NOT AT [facility]    No orders to display    Medications  cefTRIAXone (ROCEPHIN) injection 500 mg (has no administration in time range)     Procedures  /  Critical Care Procedures  ED Course and Medical Decision Making  Initial Impression and Ddx Reassuring exam with no lymphadenopathy or lesions to the genital area, vital signs normal, no systemic symptoms.  Seems consistent with chlamydia, sending urine and empirically treating.  Patient counseled on the importance of condoms given that this is his  eighth ED visit in the past 2 years for STD treatment.  Past medical/surgical history that increases complexity of ED encounter: None  Interpretation of Diagnostics Not applicable  Patient Reassessment and Ultimate Disposition/Management Discharge home  Patient management required discussion with the following services or consulting groups:  None  Complexity of Problems Addressed Acute complicated illness or Injury  Additional Data Reviewed and Analyzed Further history obtained from: Prior ED visit notes  Additional Factors Impacting ED Encounter Risk Prescriptions  [place]. [place], [place] [email or web address]  Final Clinical Impressions(s) / ED Diagnoses     ICD-10-CM   1. Penile  discharge  R36.9       ED Discharge Orders          Ordered    doxycycline (VIBRAMYCIN) 100 MG capsule  2 times daily        08/21/21 2324             Discharge Instructions Discussed with and Provided to Patient:    Discharge Instructions      You were evaluated in the Emergency Department and after careful evaluation, we did not find any emergent condition requiring admission or further testing in the hospital.  Your exam/testing today was overall reassuring.  Symptoms may be due to chlamydia or gonorrhea.  We treated you for STDs here in the emergency department.  Important that you inform any sexual partners and to refrain from sexual contact for the next 2 weeks while you are on the antibiotic  Please return to the Emergency Department if you experience any worsening of your condition.  Thank you for allowing [place] to be a part of your care.       Maudie Flakes, MD 08/21/21 2326

## 2021-08-21 NOTE — ED Triage Notes (Signed)
Pov from home. Cc of STD exposure. Unsure of who gave it to him but says he has clear discharge and burning with urination for 2 days.

## 2021-10-21 ENCOUNTER — Other Ambulatory Visit: Payer: Self-pay

## 2021-10-21 ENCOUNTER — Emergency Department (HOSPITAL_COMMUNITY)
Admission: EM | Admit: 2021-10-21 | Discharge: 2021-10-21 | Disposition: A | Discharge status: Home / Self Care | Payer: Self-pay | Attending: Emergency Medicine | Admitting: Emergency Medicine

## 2021-10-21 ENCOUNTER — Encounter (HOSPITAL_COMMUNITY): Payer: Self-pay | Admitting: *Deleted

## 2021-10-21 DIAGNOSIS — L729 Follicular cyst of the skin and subcutaneous tissue, unspecified: Secondary | ICD-10-CM | POA: Insufficient documentation

## 2021-10-21 DIAGNOSIS — Z202 Contact with and (suspected) exposure to infections with a predominantly sexual mode of transmission: Secondary | ICD-10-CM

## 2021-10-21 LAB — URINALYSIS, ROUTINE W REFLEX MICROSCOPIC
Bacteria, UA: NONE SEEN
Bilirubin Urine: NEGATIVE
Glucose, UA: NEGATIVE mg/dL
Hgb urine dipstick: NEGATIVE
Ketones, ur: NEGATIVE mg/dL
Leukocytes,Ua: NEGATIVE
Nitrite: NEGATIVE
Protein, ur: 30 mg/dL — AB
Specific Gravity, Urine: 1.028 (ref 1.005–1.030)
pH: 6 (ref 5.0–8.0)

## 2021-10-21 LAB — HIV ANTIBODY (ROUTINE TESTING W REFLEX): HIV Screen 4th Generation wRfx: NONREACTIVE

## 2021-10-21 MED ORDER — METRONIDAZOLE 500 MG PO TABS: 500.0000 mg | ORAL_TABLET | Freq: Two times a day (BID) | ORAL | 0 refills | Status: AC

## 2021-10-21 MED ORDER — DOXYCYCLINE HYCLATE 100 MG PO CAPS: 100.0000 mg | ORAL_CAPSULE | Freq: Two times a day (BID) | ORAL | 0 refills | Status: AC

## 2021-10-21 NOTE — Discharge Instructions (Signed)
It is unclear if you have chlamydia, I think this is more of a cyst as its on your leg but is possible this could be an atypical presentation.  I am going to put you on doxycycline this will cover you for possible infection.  Please take as prescribed.   ? ?Since your partner has trichomonas I am putting you on Flagyl this will treat your trichomonas infection, please take as prescribed do not take alcohol taking this medication.  Please abstain from sexual intercourse for at least 7 days she cannot spread this to your partners. ? ?Please follow with the health department. ? ?Come back to the emergency department if you develop chest pain, shortness of breath, severe abdominal pain, uncontrolled nausea, vomiting, diarrhea. ? ?

## 2021-10-21 NOTE — ED Triage Notes (Signed)
Pt presents for genital lesion with similar occurrence last year, states, "I went to the health department and they told me to come here for blood work for herpes. I came in contact with someone who had it." Pt reports single lesion on penis at this time, A&O x4 ?

## 2021-10-21 NOTE — ED Provider Notes (Signed)
?[facility] ?Provider Note ? ? ?CSN: [id number] ?Arrival date & time: 10/21/21  0932 ? ?  ? ?History ? ?No chief complaint on file. ? ? ?Bobby Scott is a 26 y.o. male. ? ?HPI ? ?Patient with medical history including multiple STIs presents emerged part complaints of possible STD.  Patient states that he went to the health department as he had this boil form on the right medial aspect of the upper thigh and they told him that he had herpes and was told to come here for further evaluation.  He states this boil came about 3 days ago, states that popped on its own and now is draining, he states it slightly painful to touch, he states he never had this in the past.  He has no other boils anywhere else, he denies any testicular pain or penile discharge, he does endorse some slight dysuria with urination but denies flank tenderness suprapubic pain nausea vomiting diarrhea.  He states he is sexually active with other females and that his partner is currently being treated for Flagyl as well as herpes. ? ?Reviewed patient's chart been seen 1 time for STIs, reviewed previous lab work recent gonorrhea chlamydia are all negative. ? ?Home Medications ?Prior to Admission medications   ?Medication Sig Start Date End Date Taking? Authorizing Provider  ?doxycycline (VIBRAMYCIN) 100 MG capsule Take 1 capsule (100 mg total) by mouth 2 (two) times daily for 7 days. 10/21/21 10/28/21 Yes Marcello Fennel, PA-C  ?metroNIDAZOLE (FLAGYL) 500 MG tablet Take 1 tablet (500 mg total) by mouth 2 (two) times daily for 7 days. 10/21/21 10/28/21 Yes Marcello Fennel, PA-C  ?   ? ?Allergies    ?Bee venom and Penicillins   ? ?Review of Systems   ?Review of Systems  ?Constitutional:  Negative for chills and fever.  ?Respiratory:  Negative for shortness of breath.   ?Cardiovascular:  Negative for chest pain.  ?Gastrointestinal:  Negative for abdominal pain.  ?Genitourinary:  Positive for dysuria.  ?Skin:  Positive for  rash.  ?Neurological:  Negative for headaches.  ? ?Physical Exam ?Updated Vital Signs ?BP 117/69 (BP Location: Right Arm)   Pulse 65   Temp 98 ?F (36.7 ?C) (Oral)   Resp 14   SpO2 100%  ?Physical Exam ?Vitals and nursing note reviewed. Exam conducted with a chaperone present.  ?Constitutional:   ?   General: He is not in acute distress. ?   Appearance: He is not ill-appearing.  ?HENT:  ?   Head: Normocephalic and atraumatic.  ?   Nose: No congestion.  ?   Mouth/Throat:  ?   Mouth: Mucous membranes are moist.  ?   Pharynx: Oropharynx is clear.  ?   Comments: Tongue uvula both midline controlling oral secretions tonsils use symmetric bilaterally, no erythema or edema present no tongue elevation no trismus torticollis ?Eyes:  ?   Conjunctiva/sclera: Conjunctivae normal.  ?Cardiovascular:  ?   Rate and Rhythm: Normal rate and regular rhythm.  ?   Pulses: Normal pulses.  ?   Heart sounds: No murmur heard. ?  No friction rub. No gallop.  ?Pulmonary:  ?   Effort: No respiratory distress.  ?   Breath sounds: No wheezing, rhonchi or rales.  ?Abdominal:  ?   Palpations: Abdomen is soft.  ?   Tenderness: There is no right CVA tenderness, left CVA tenderness or guarding.  ?Genitourinary: ?   Penis: Normal.   ?   Testes: Normal.  ?  Comments: With chaperone present genital exam was performed patient has a circumcised penis 2 descended testicles, no rash or penile discharge present, he has a small pustule noted on the medial aspect of the upper right thigh, actively draining, no surrounding erythema, no fluctuation present, is hard to the touch about the size of a small bead ?Skin: ?   General: Skin is warm and dry.  ?Neurological:  ?   Mental Status: He is alert.  ?Psychiatric:     ?   Mood and Affect: Mood normal.  ? ? ?ED Results / Procedures / Treatments   ?Labs ?(all labs ordered are listed, but only abnormal results are displayed) ?Labs Reviewed  ?URINALYSIS, ROUTINE W REFLEX MICROSCOPIC - Abnormal; Notable for the  following components:  ?    Result Value  ? APPearance HAZY (*)   ? Protein, ur 30 (*)   ? All other components within normal limits  ?RPR  ?HIV ANTIBODY (ROUTINE TESTING W REFLEX)  ?GC/CHLAMYDIA PROBE AMP ([place]) NOT AT [facility]  ? ? ?EKG ?None ? ?Radiology ?No results found. ? ?Procedures ?Procedures  ? ? ?Medications Ordered in ED ?Medications - No data to display ? ?ED Course/ Medical Decision Making/ A&P ?  ?                        ?Medical Decision Making ?Amount and/or Complexity of Data Reviewed ?Labs: ordered. ? ? ?This patient presents to the ED for concern of STD, this involves an extensive number of treatment options, and is a complaint that carries with it a high risk of complications and morbidity.  The differential diagnosis includes gonorrhea, chlamydia, herpes ? ? ? ?Additional history obtained: ? ?Additional history obtained from N/A ?External records from outside source obtained and reviewed including previous urine cultures and lab work ? ? ?Co morbidities that complicate the patient evaluation ? ?N/A ? ?Social Determinants of Health: ? ?N/A ? ? ? ?Lab Tests: ? ?I Ordered, and personally interpreted labs.  The pertinent results include: UA is unremarkable, RPR pending, GC chlamydia pending HIV pending ? ? ?Imaging Studies ordered: ? ?I ordered imaging studies including N/A ?I independently visualized and interpreted imaging which showed N/A ?I agree with the radiologist interpretation ? ? ?Cardiac Monitoring: ? ?The patient was maintained on a cardiac monitor.  I personally viewed and interpreted the cardiac monitored which showed an underlying rhythm of: N/A ? ? ?Medicines ordered and prescription drug management: ? ?I ordered medication including N/A ?I have reviewed the patients home medicines and have made adjustments as needed ? ?Critical Interventions: ? ?N/A ? ? ?Reevaluation: ? ?General exam was performed, patient is a small pustule noted on the right inner aspect of his thigh, atypical  of herpes, more consistent with likely cyst, no fluctuance or induration present.  Will obtain basic lab work-up and reassess ? ? ? ?Consultations Obtained: ? ?N/A ? ? ?Test Considered: ? ?I considered I&D but will defer as my suspicion for drainable abscess very low at this time, there is no fluctuant induration present, it was a hard nodule which was actively draining. ? ? ? ?Rule out ?Low suspicion for pyelonephritis or UTI as patient denies urinary symptoms, lower back pain, nausea vomiting fevers or chills.  Low suspicion for epididymitis or prostatitis as patient denies saddle pain, testicles are nontender and stable patient.  Low suspicion for herpes as the nodule seen on his right thigh has no vesicles, this is an atypical  location, which this is likely a cyst.  Low suspicion for STD as patient denies penile discharge, testicular pain, or rash around the genitalia.  Low suspicion for systemic infection as patient is nontoxic-appearing, vital signs reassuring, no obvious source infection noted on exam.   ? ? ?Dispostion and problem list ? ?After consideration of the diagnostic results and the patients response to treatment, I feel that the patent would benefit from discharge. ? ?Boil right leg-I suspect this is more likely a cyst that is active during this time, no overlying skin changes, will start him on antibiotics as possibly to be infectious.  Respiration for herpes or lower at this time.  We will have him follow-up with PCP for further evaluation ?Dysuria-patient is reexposure to trichomonas, will start him on Flagyl, recommend abstinence follow-up with health department for further evaluation. ? ? ? ? ? ? ? ? ? ? ? ?Final Clinical Impression(s) / ED Diagnoses ?Final diagnoses:  ?STD exposure  ?Generalized skin cysts  ? ? ?Rx / DC Orders ?ED Discharge Orders   ? ?      Ordered  ?  doxycycline (VIBRAMYCIN) 100 MG capsule  2 times daily       ? 10/21/21 1228  ?  metroNIDAZOLE (FLAGYL) 500 MG tablet  2  times daily       ? 10/21/21 1228  ? ?  ?  ? ?  ? ? ?  ?Marcello Fennel, PA-C ?10/21/21 1240 ? ?  ?Fredia Sorrow, MD ?10/24/21 2336 ? ?

## 2021-10-22 LAB — RPR: RPR Ser Ql: NONREACTIVE

## 2021-10-24 LAB — GC/CHLAMYDIA PROBE AMP (~~LOC~~) NOT AT ARMC
Chlamydia: NEGATIVE
Comment: NEGATIVE
Comment: NORMAL
Neisseria Gonorrhea: NEGATIVE

## 2021-10-25 NOTE — Congregational Nurse Program (Signed)
Pt requested to shop at [employer] on today for family of 5.  ? ?In addition he requested job resources due to currently being unemployed ? ? ?Plan ?-Provided food at market at [facility] ? ?-Provided a CC [employer] [place] and given the contact information for [employer] and [employer] to further assist ? ? ?

## 2021-11-06 ENCOUNTER — Emergency Department (HOSPITAL_COMMUNITY)
Admission: EM | Admit: 2021-11-06 | Discharge: 2021-11-06 | Disposition: A | Discharge status: Home / Self Care | Payer: Self-pay | Attending: Emergency Medicine | Admitting: Emergency Medicine

## 2021-11-06 ENCOUNTER — Other Ambulatory Visit: Payer: Self-pay

## 2021-11-06 ENCOUNTER — Encounter (HOSPITAL_COMMUNITY): Payer: Self-pay | Admitting: *Deleted

## 2021-11-06 DIAGNOSIS — R369 Urethral discharge, unspecified: Secondary | ICD-10-CM | POA: Insufficient documentation

## 2021-11-06 LAB — URINALYSIS, ROUTINE W REFLEX MICROSCOPIC
Bilirubin Urine: NEGATIVE
Glucose, UA: NEGATIVE mg/dL
Hgb urine dipstick: NEGATIVE
Ketones, ur: NEGATIVE mg/dL
Leukocytes,Ua: NEGATIVE
Nitrite: NEGATIVE
Protein, ur: 30 mg/dL — AB
Specific Gravity, Urine: 1.023 (ref 1.005–1.030)
pH: 6 (ref 5.0–8.0)

## 2021-11-06 MED ORDER — LIDOCAINE HCL (PF) 1 % IJ SOLN
INTRAMUSCULAR | Status: AC
  Administered 2021-11-06: 1 mL
  Filled 2021-11-06: qty 2

## 2021-11-06 MED ORDER — LIDOCAINE HCL (PF) 2 % IJ SOLN
INTRAMUSCULAR | Status: AC
  Filled 2021-11-06: qty 10

## 2021-11-06 MED ORDER — DOXYCYCLINE HYCLATE 100 MG PO CAPS: 100.0000 mg | ORAL_CAPSULE | Freq: Two times a day (BID) | ORAL | 0 refills | Status: DC

## 2021-11-06 MED ORDER — CEFTRIAXONE SODIUM 500 MG IJ SOLR
500.0000 mg | Freq: Once | INTRAMUSCULAR | Status: AC
Start: 2021-11-06 — End: 2021-11-06
  Administered 2021-11-06: 500 mg via INTRAMUSCULAR
  Filled 2021-11-06: qty 500

## 2021-11-06 NOTE — ED Triage Notes (Signed)
Pt recently here for STD, states he finished his medication and still having penile discharge.  ?

## 2021-11-06 NOTE — ED Notes (Signed)
Pt given a urinal and informed of need of urine sample  

## 2021-11-06 NOTE — ED Provider Notes (Signed)
?[facility] EMERGENCY DEPARTMENT ?Provider Note ? ? ?CSN: [id number] ?Arrival date & time: 11/06/21  1034 ? ?  ? ?History ? ?Chief Complaint  ?Patient presents with  ? Penile Discharge  ? ? ?Bobby Scott is a 26 y.o. male. ? ? ?Penile Discharge ? ? ?Patient presents with penile discharge x4 days.  Patient states she was seen here 2 weeks ago due to trichomonas exposure, at that time he was asymptomatic.  Reports he took Flagyl but missed 1 days worth of doses.  Denies any new sexual exposures, has not had sex since trichomonas exposure 2 weeks ago.  No dysuria or hematuria, no new rashes.  Denies any testicular pain, no fevers. ? ?Home Medications ?Prior to Admission medications   ?Not on File  ?   ? ?Allergies    ?Bee venom and Penicillins   ? ?Review of Systems   ?Review of Systems  ?Genitourinary:  Positive for penile discharge.  ? ?Physical Exam ?Updated Vital Signs ?BP 119/71 (BP Location: Right Arm)   Pulse 82   Temp 97.9 ?F (36.6 ?C) (Oral)   Resp 14   Ht 6\' 3"  (1.905 m)   Wt 68 kg   SpO2 100%   BMI 18.75 kg/m?  ?Physical Exam ?Vitals and nursing note reviewed. Exam conducted with a chaperone present.  ?Constitutional:   ?   General: He is not in acute distress. ?   Appearance: Normal appearance.  ?HENT:  ?   Head: Normocephalic and atraumatic.  ?Eyes:  ?   General: No scleral icterus. ?   Extraocular Movements: Extraocular movements intact.  ?   Pupils: Pupils are equal, round, and reactive to light.  ?Genitourinary: ?   Comments: Chaperone in room.  No erythema or discharge noted to the urethra, no rashes or chancres to the penile shaft.  Testicles roughly symmetric, no erythema or swelling ?Skin: ?   Coloration: Skin is not jaundiced.  ?Neurological:  ?   Mental Status: He is alert. Mental status is at baseline.  ?   Coordination: Coordination normal.  ? ? ?ED Results / Procedures / Treatments   ?Labs ?(all labs ordered are listed, but only abnormal results are displayed) ?Labs Reviewed   ?URINALYSIS, ROUTINE W REFLEX MICROSCOPIC  ?GC/CHLAMYDIA PROBE AMP ([place]) NOT AT [facility]  ? ? ?EKG ?None ? ?Radiology ?No results found. ? ?Procedures ?Procedures  ? ? ?Medications Ordered in ED ?Medications  ?cefTRIAXone (ROCEPHIN) injection 500 mg (has no administration in time range)  ? ? ?ED Course/ Medical Decision Making/ A&P ?  ?                        ?Medical Decision Making ?Amount and/or Complexity of Data Reviewed ?Labs: ordered. ? ?Risk ?Prescription drug management. ? ? ?Patient presents with penile discharge.  Differential diagnosis includes but is not limited to STD, urethritis, candidal infection. ? ?I reviewed most recent ED visit including the results.  Patient was tested 2 weeks ago for gonorrhea and chlamydia was negative.  He was also tested for RPR and HIV which were all nonreactive. ? ?I ordered a UA and repeat GC chlamydia.  We will abstain from reordering RPR and HIV given collected 2 weeks ago.  Patient was treated with Flagyl, he was given Rocephin in February of this year for similar STD exposure. ? ?I ordered the patient 500 mg of ceftriaxone.  We will treat empirically with doxycycline and provide him follow-up with the health  clinic.   ? ? ? ? ? ? ? ?Final Clinical Impression(s) / ED Diagnoses ?Final diagnoses:  ?None  ? ? ?Rx / DC Orders ?ED Discharge Orders   ? ? None  ? ?  ? ? ?  ?Theron Arista, PA-C ?11/06/21 1839 ? ?  ?Bethann Berkshire, MD ?11/08/21 1655 ? ?

## 2021-11-06 NOTE — Discharge Instructions (Addendum)
Take doxycycline twice daily for 10 days. ?Follow-up with the health department in 1 to 2 weeks for repeat testing. ?Return to ED if you are developing fevers, unable to urinate, testicular pain or new or concerning symptoms. ?

## 2021-11-07 ENCOUNTER — Telehealth: Payer: Self-pay

## 2021-11-07 NOTE — Telephone Encounter (Signed)
Attempted to follow up with the patient regarding any needs after his ER visit on 4.30.23.   Pt was not available at the time of call, but did leave a message with his girlfriend to return the phone call at the contact information provided ? ?

## 2021-11-07 NOTE — ED Notes (Addendum)
Phone call attempted to let him know to follow up at the health department for a recollect on the GC/chlamydia test. ?

## 2022-06-30 ENCOUNTER — Emergency Department (HOSPITAL_COMMUNITY): Payer: No Typology Code available for payment source

## 2022-06-30 ENCOUNTER — Other Ambulatory Visit: Payer: Self-pay

## 2022-06-30 ENCOUNTER — Emergency Department (HOSPITAL_COMMUNITY)
Admission: EM | Admit: 2022-06-30 | Discharge: 2022-06-30 | Disposition: A | Discharge status: Home / Self Care | Payer: No Typology Code available for payment source | Attending: Emergency Medicine | Admitting: Emergency Medicine

## 2022-06-30 ENCOUNTER — Encounter (HOSPITAL_COMMUNITY): Payer: Self-pay | Admitting: Emergency Medicine

## 2022-06-30 DIAGNOSIS — S29019A Strain of muscle and tendon of unspecified wall of thorax, initial encounter: Secondary | ICD-10-CM

## 2022-06-30 DIAGNOSIS — S161XXA Strain of muscle, fascia and tendon at neck level, initial encounter: Secondary | ICD-10-CM | POA: Diagnosis not present

## 2022-06-30 DIAGNOSIS — F17210 Nicotine dependence, cigarettes, uncomplicated: Secondary | ICD-10-CM | POA: Diagnosis not present

## 2022-06-30 DIAGNOSIS — S29012A Strain of muscle and tendon of back wall of thorax, initial encounter: Secondary | ICD-10-CM | POA: Insufficient documentation

## 2022-06-30 DIAGNOSIS — Y92410 Unspecified street and highway as the place of occurrence of the external cause: Secondary | ICD-10-CM | POA: Insufficient documentation

## 2022-06-30 DIAGNOSIS — S199XXA Unspecified injury of neck, initial encounter: Secondary | ICD-10-CM | POA: Diagnosis present

## 2022-06-30 HISTORY — DX: Burn of unspecified body region, unspecified degree: T30.0

## 2022-06-30 MED ORDER — METHOCARBAMOL 500 MG PO TABS: 500.0000 mg | ORAL_TABLET | Freq: Two times a day (BID) | ORAL | 0 refills | Status: DC | PRN

## 2022-06-30 MED ORDER — IOHEXOL 300 MG/ML  SOLN
100.0000 mL | Freq: Once | INTRAMUSCULAR | Status: AC | PRN
Start: 2022-06-30 — End: 2022-06-30
  Administered 2022-06-30: 100 mL via INTRAVENOUS

## 2022-06-30 MED ORDER — NAPROXEN 500 MG PO TABS: 500.0000 mg | ORAL_TABLET | Freq: Two times a day (BID) | ORAL | 0 refills | Status: DC

## 2022-06-30 NOTE — Discharge Instructions (Signed)
Please take Naprosyn, 500mg  by mouth twice daily as needed for pain - this in an antiinflammatory medicine (NSAID) and is similar to ibuprofen - many people feel that it is stronger than ibuprofen and it is easier to take since it is a smaller pill.  Please use this only for 1 week - if your pain persists, you will need to follow up with your doctor in the office for ongoing guidance and pain control.   Please take Robaxin, 500 mg up to 2 or 3 times a day as needed for muscle spasm, this is a muscle relaxer, it may cause generalized weakness, sleepiness and you should not drive or do important things while taking this medication.  This includes driving a vehicle or taking care of young children, these things should not be done while taking this medication.   Thank you for allowing to treat you in the emergency department today.  After reviewing your examination and potential testing that was done it appears that you are safe to go home.  I would like for you to follow-up with your doctor within the next several days, have them obtain your results and follow-up with them to review all of these tests.  If you should develop severe or worsening symptoms return to the emergency department immediately   Thankfully all of your x-rays and CAT scans were normal, there is no signs of injury, lots of muscular pain.  This will gradually get better over 10 days, see your doctor as needed
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
## 2022-10-03 ENCOUNTER — Other Ambulatory Visit: Payer: Self-pay

## 2022-10-03 ENCOUNTER — Encounter (HOSPITAL_COMMUNITY): Payer: Self-pay | Admitting: *Deleted

## 2022-10-03 ENCOUNTER — Emergency Department (HOSPITAL_COMMUNITY)
Admission: EM | Admit: 2022-10-03 | Discharge: 2022-10-03 | Disposition: A | Discharge status: Home / Self Care | Payer: Self-pay | Attending: Emergency Medicine | Admitting: Emergency Medicine

## 2022-10-03 DIAGNOSIS — R300 Dysuria: Secondary | ICD-10-CM | POA: Insufficient documentation

## 2022-10-03 DIAGNOSIS — Z20822 Contact with and (suspected) exposure to covid-19: Secondary | ICD-10-CM | POA: Insufficient documentation

## 2022-10-03 DIAGNOSIS — Z113 Encounter for screening for infections with a predominantly sexual mode of transmission: Secondary | ICD-10-CM | POA: Insufficient documentation

## 2022-10-03 LAB — URINALYSIS, ROUTINE W REFLEX MICROSCOPIC
Bilirubin Urine: NEGATIVE
Glucose, UA: NEGATIVE mg/dL
Hgb urine dipstick: NEGATIVE
Ketones, ur: NEGATIVE mg/dL
Leukocytes,Ua: NEGATIVE
Nitrite: NEGATIVE
Protein, ur: NEGATIVE mg/dL
Specific Gravity, Urine: 1.019 (ref 1.005–1.030)
pH: 6 (ref 5.0–8.0)

## 2022-10-03 LAB — SARS CORONAVIRUS 2 BY RT PCR: SARS Coronavirus 2 by RT PCR: NEGATIVE

## 2022-10-03 NOTE — ED Triage Notes (Signed)
Pt states kids and wife have covid, requesting a covid test.  Pt denies any covid symptoms currently.  Pt also requesting STD check-denies any penile discharge or burning with urination.

## 2022-10-03 NOTE — ED Provider Notes (Signed)
[facility] Provider Note   CSN: [id number] Arrival date & time: 10/03/22  1123     History  No chief complaint on file.   Bobby Scott is a 27 y.o. male.  HPI     Bobby Scott is a 27 y.o. male who presents to the Emergency Department questing a COVID test.  States his wife and child tested positive earlier today for COVID he denies being any symptoms at this time.  Also, he is requesting STD check.  He has some discomfort post void beginning yesterday.  Denies any pain swelling or discharge from his penis.  No swelling or tenderness of his scrotum.  No genital rash.   Home Medications Prior to Admission medications   Medication Sig Start Date End Date Taking? Authorizing Provider  methocarbamol (ROBAXIN) 500 MG tablet Take 1 tablet (500 mg total) by mouth 2 (two) times daily as needed for muscle spasms. 06/30/22   Noemi Chapel, MD  naproxen (NAPROSYN) 500 MG tablet Take 1 tablet (500 mg total) by mouth 2 (two) times daily with a meal. 06/30/22   Noemi Chapel, MD      Allergies    Bee venom and Penicillins    Review of Systems   Review of Systems  Constitutional:  Negative for appetite change, chills and fever.  HENT:  Negative for congestion, rhinorrhea, sore throat and trouble swallowing.   Respiratory:  Negative for cough and shortness of breath.   Cardiovascular:  Negative for chest pain.  Gastrointestinal:  Negative for abdominal pain, nausea and vomiting.  Genitourinary:  Positive for dysuria. Negative for decreased urine volume, genital sores, hematuria, penile discharge, penile pain, penile swelling, scrotal swelling and testicular pain.  Musculoskeletal:  Negative for arthralgias.  Neurological:  Negative for dizziness, weakness and headaches.    Physical Exam Updated Vital Signs BP 119/70 (BP Location: Right Arm)   Pulse (!) 58   Temp 97.8 F (36.6 C) (Oral)   Resp 16   Ht 6\' 3"  (1.905 m)   Wt 68  kg   SpO2 100%   BMI 18.75 kg/m  Physical Exam Vitals and nursing note reviewed.  Constitutional:      General: He is not in acute distress.    Appearance: Normal appearance. He is not ill-appearing or toxic-appearing.  Cardiovascular:     Rate and Rhythm: Normal rate and regular rhythm.     Pulses: Normal pulses.  Pulmonary:     Effort: Pulmonary effort is normal.     Breath sounds: Normal breath sounds.  Abdominal:     Palpations: Abdomen is soft.     Tenderness: There is no abdominal tenderness.  Musculoskeletal:        General: Normal range of motion.     Cervical back: Normal range of motion.  Lymphadenopathy:     Cervical: No cervical adenopathy.  Skin:    General: Skin is warm.     Capillary Refill: Capillary refill takes less than 2 seconds.  Neurological:     General: No focal deficit present.     Mental Status: He is alert.     Sensory: No sensory deficit.     Motor: No weakness.     ED Results / Procedures / Treatments   Labs (all labs ordered are listed, but only abnormal results are displayed) Labs Reviewed  SARS CORONAVIRUS 2 BY RT PCR  URINALYSIS, ROUTINE W REFLEX MICROSCOPIC  GC/CHLAMYDIA PROBE AMP ([facility]) NOT AT  [place]    EKG None  Radiology No results found.  Procedures Procedures    Medications Ordered in ED Medications - No data to display  ED Course/ Medical Decision Making/ A&P                             Medical Decision Making Patient here for requesting evaluation for COVID exposure.  Denies any symptoms at this time.  He is also requesting STD check.  Has some discomfort post void that began yesterday.  No penile discharge, swelling, or pain.  No genital rash.  Clinically, I suspect patient needs reassurance.  He is asymptomatic.  COVID and GC chlamydia test obtained  Amount and/or Complexity of Data Reviewed Labs: ordered.    Details: COVID test negative, urinalysis without evidence of infection.  GC chlamydia  pending Discussion of management or test interpretation with external provider(s): Patient reassured.  Appropriate for discharge home.  Recommended repeat COVID testing in 48 hours if needed.  Patient advised that he can have COVID recheck from urgent care, health department or home test kit           Final Clinical Impression(s) / ED Diagnoses Final diagnoses:  Exposure to COVID-19 virus  Screening examination for STD (sexually transmitted disease)    Rx / DC Orders ED Discharge Orders     None         Kem Parkinson, PA-C 10/03/22 1325    Godfrey Pick, MD 10/03/22 [phone]

## 2022-10-03 NOTE — Discharge Instructions (Signed)
Your COVID test today was negative, as discussed, you may want to retest in 48 hours.  May take Tylenol or ibuprofen if needed for fever or bodyaches if you develop symptoms.  You may review the pending results on MyChart in 2 to 3 days.

## 2022-10-04 LAB — GC/CHLAMYDIA PROBE AMP (~~LOC~~) NOT AT ARMC
Chlamydia: NEGATIVE
Comment: NEGATIVE
Comment: NORMAL
Neisseria Gonorrhea: NEGATIVE

## 2022-10-11 ENCOUNTER — Telehealth: Payer: Self-pay

## 2022-10-11 NOTE — Telephone Encounter (Signed)
Successully spoke with patient upon him returning call on today.    He states he has no current medical needs as it relates medications or any additional medical concerns since being released from hospital ed on 3.26.24.   States he is doing well and has no concerns at this time  Pt was re-educated on the use of Urgent vs Emergency room in the event his PCP office is not available or open for business.    Urgent vs emergency conditions were also reviewed with the patient on the appropriate time to utilize the ED.    Pt was also reminded of his enrollment with the [employer] Program  is soon to be expire on 4.5.26, in addition to he could receive assistance with applying for Medicaid as well  Pt states he understood and continues to be interested in maintaining enrollment with [employer] as well as applying for [employer] to be able to continue with being able to properly access medical care.    He agreed to schedule an appointment to complete the Renewal enrollment process and Medicaid application on MONDAY, October 16, 2022 AT 4:00PM at the [facility]

## 2022-10-11 NOTE — Telephone Encounter (Signed)
Made 1st  attempt to hospital  f/u with pt by phone call by leaving a  voicemail.  Pt was unavailable at the time of the call.     This call was in attempt to provide nurse case management to ensure pt did not have any additional questions or concerns regarding any post hospital instruction or medical/health concerns, obtaining her medications and serving as a reminder of any upcoming appointments, in addition to assisting pt with RENEWAL enrollment with [employer] and with assistance with applying for Medicaid.

## 2022-10-11 NOTE — Telephone Encounter (Signed)
Made 2nd  attempt to hospital  f/u with pt by phone call by sending a text message.  This call was in attempt to provide nurse case management to ensure pt did not have any additional questions or concerns regarding any post hospital instruction or medical/health concerns, obtaining her medications and serving as a reminder of any upcoming appointments, in addition to assisting pt with RENEWAL enrollment with [employer] and with assistance with applying for Medicaid.  Will make a 3rd or final attempt, by phone call if pt does not return call first

## 2022-11-21 ENCOUNTER — Other Ambulatory Visit: Payer: Self-pay

## 2022-11-21 ENCOUNTER — Emergency Department (HOSPITAL_COMMUNITY)
Admission: EM | Admit: 2022-11-21 | Discharge: 2022-11-21 | Disposition: A | Discharge status: Home / Self Care | Payer: Self-pay | Attending: Emergency Medicine | Admitting: Emergency Medicine

## 2022-11-21 ENCOUNTER — Encounter (HOSPITAL_COMMUNITY): Payer: Self-pay

## 2022-11-21 DIAGNOSIS — N341 Nonspecific urethritis: Secondary | ICD-10-CM | POA: Insufficient documentation

## 2022-11-21 DIAGNOSIS — N342 Other urethritis: Secondary | ICD-10-CM

## 2022-11-21 LAB — URINALYSIS, ROUTINE W REFLEX MICROSCOPIC
Bilirubin Urine: NEGATIVE
Glucose, UA: NEGATIVE mg/dL
Hgb urine dipstick: NEGATIVE
Ketones, ur: NEGATIVE mg/dL
Leukocytes,Ua: NEGATIVE
Nitrite: NEGATIVE
Protein, ur: NEGATIVE mg/dL
Specific Gravity, Urine: 1.028 (ref 1.005–1.030)
pH: 6 (ref 5.0–8.0)

## 2022-11-21 MED ORDER — CEFTRIAXONE SODIUM 500 MG IJ SOLR
500.0000 mg | Freq: Once | INTRAMUSCULAR | Status: AC
  Administered 2022-11-21: 500 mg via INTRAMUSCULAR
  Filled 2022-11-21: qty 500

## 2022-11-21 MED ORDER — DOXYCYCLINE HYCLATE 100 MG PO CAPS: 100.0000 mg | ORAL_CAPSULE | Freq: Two times a day (BID) | ORAL | 0 refills | Status: DC

## 2022-11-21 MED ORDER — LIDOCAINE HCL (PF) 1 % IJ SOLN
INTRAMUSCULAR | Status: AC
Start: 2022-11-21 — End: 2022-11-21
  Filled 2022-11-21: qty 5

## 2022-11-21 NOTE — ED Provider Notes (Signed)
[place] EMERGENCY DEPARTMENT AT [facility] Provider Note   CSN: [id number] Arrival date & time: 11/21/22  1131     History  Chief Complaint  Patient presents with   [facility] TRANSMITTED DISEASE    Bobby Scott is a 27 y.o. male.  Complains of a penile discharge.  Patient is requesting treatment for gonorrhea and chlamydia.  Patient is concerned that he was exposed to a sexually transmitted disease.  Patient denies any fever or chills he is not having any abdominal pain  The history is provided by the patient. No language interpreter was used.       Home Medications Prior to Admission medications   Medication Sig Start Date End Date Taking? Authorizing Provider  doxycycline (VIBRAMYCIN) 100 MG capsule Take 1 capsule (100 mg total) by mouth 2 (two) times daily. 11/21/22  Yes Elson Areas, PA-C      Allergies    Bee venom and Penicillins    Review of Systems   Review of Systems  All other systems reviewed and are negative.   Physical Exam Updated Vital Signs BP 124/73 (BP Location: Left Arm)   Pulse 76   Temp 98.8 F (37.1 C) (Oral)   Resp 20   Ht 6\' 3"  (1.905 m)   Wt 74.8 kg   SpO2 100%   BMI 20.62 kg/m  Physical Exam Vitals and nursing note reviewed.  Constitutional:      Appearance: He is well-developed.  HENT:     Head: Normocephalic.  Cardiovascular:     Rate and Rhythm: Normal rate.  Pulmonary:     Effort: Pulmonary effort is normal.  Abdominal:     General: There is no distension.  Musculoskeletal:        General: Normal range of motion.     Cervical back: Normal range of motion.  Skin:    General: Skin is warm.  Neurological:     General: No focal deficit present.     Mental Status: He is alert and oriented to person, place, and time.     ED Results / Procedures / Treatments   Labs (all labs ordered are listed, but only abnormal results are displayed) Labs Reviewed  URINE CULTURE  URINALYSIS, ROUTINE W REFLEX  MICROSCOPIC  GC/CHLAMYDIA PROBE AMP ([place]) NOT AT [facility]    EKG None  Radiology No results found.  Procedures Procedures    Medications Ordered in ED Medications  cefTRIAXone (ROCEPHIN) injection 500 mg (has no administration in time range)    ED Course/ Medical Decision Making/ A&P                             Medical Decision Making Patient complains of a penile discharge  Amount and/or Complexity of Data Reviewed Labs: ordered.    Details: GC and Chlamydia ordered and pending  Risk Prescription drug management. Risk Details: Patient is given an injection of Ceftin and a prescription for doxycycline.  Patient is given information on safe sex           Final Clinical Impression(s) / ED Diagnoses Final diagnoses:  Urethritis    Rx / DC Orders ED Discharge Orders          Ordered    doxycycline (VIBRAMYCIN) 100 MG capsule  2 times daily        11/21/22 1320           An After Visit Summary was  printed and given to the patient.    Elson Areas, PA-C 11/21/22 1324    Loetta Rough, MD 11/22/22 1052

## 2022-11-21 NOTE — ED Triage Notes (Signed)
Pt endorses penile discharge x 2 days. Recently had unprotected sexual encounter with new partner.   Denies any urinary symptoms. Denies any known exposures.

## 2022-11-21 NOTE — Discharge Instructions (Signed)
Return if any problems.

## 2022-11-22 LAB — URINE CULTURE: Culture: NO GROWTH

## 2022-11-28 ENCOUNTER — Telehealth: Payer: Self-pay

## 2022-11-28 NOTE — Telephone Encounter (Signed)
Made first (1) attempt to f/u with pt by phone but pt was unavailable at the time of the call.     This call was in attempt to provide nurse case management to ensure pt did not have any additional questions or concerns regarding any post hospital instruction,medical concerns, obtaining her medications and serving as a reminder of any upcoming appointments.  In addition also wanted to advise pt that his enrollment with [facility] program has expired and also to advise him on how he can maintain connection with her PCP and accessing other healthcare resources  Will attempt to make additional phone contact, if pt does not attempt  to return call first.

## 2022-11-29 ENCOUNTER — Telehealth: Payer: Self-pay

## 2022-11-29 NOTE — Telephone Encounter (Signed)
Made 2nd (2) attempt to provide hospital f/u with pt by text message  This call was in attempt to provide nurse case management to ensure pt did not have any additional questions or concerns regarding any post hospital instruction,medical concerns, obtaining her medications and serving as a reminder of any upcoming appointments.   In addition also wanted to advise pt that his enrollment with [facility] program has expired and also to advise him on how he can maintain connection with her PCP and accessing other healthcare resources  Will attempt to make a 3rd attempt if pt does not attempt to return call first.

## 2022-12-09 IMAGING — CT CT RENAL STONE PROTOCOL
2 of 4 series · 16 of 46 positions shown, 18 images · non-contrast
Comparison: CT 11/01/2017

CLINICAL DATA: Lower back pain sharp abdominal lower pain

EXAM:
CT ABDOMEN AND PELVIS WITHOUT CONTRAST
TECHNIQUE: Multidetector CT imaging of the abdomen and pelvis was performed
following the standard protocol without IV contrast.

[Series 2: axial st · axial · 0.75mm/px · z∈[-531,-126]mm · 13 of 93 slices shown, 15 images]
[im 6/93  soft-tissue]
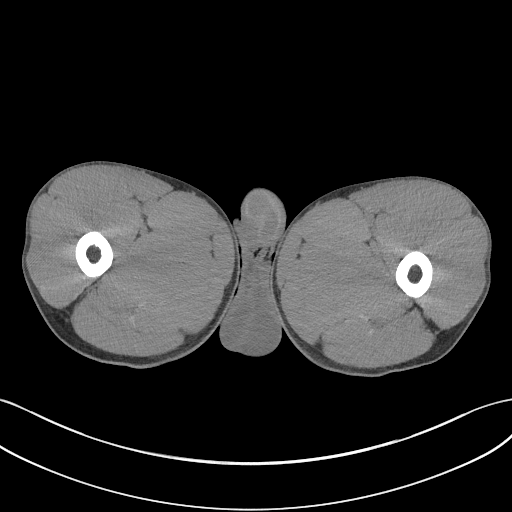
[im 6/93  bone]
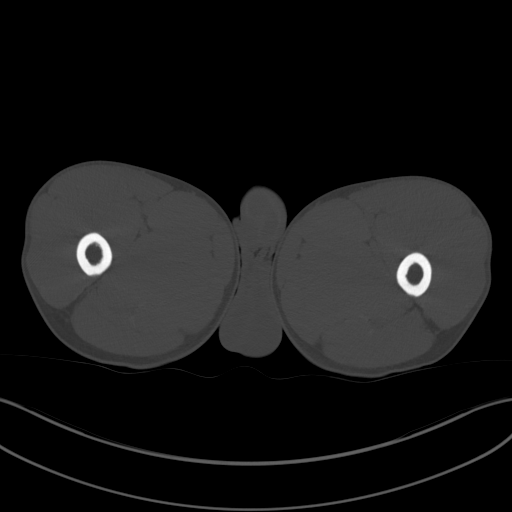
[im 11/93  soft-tissue]
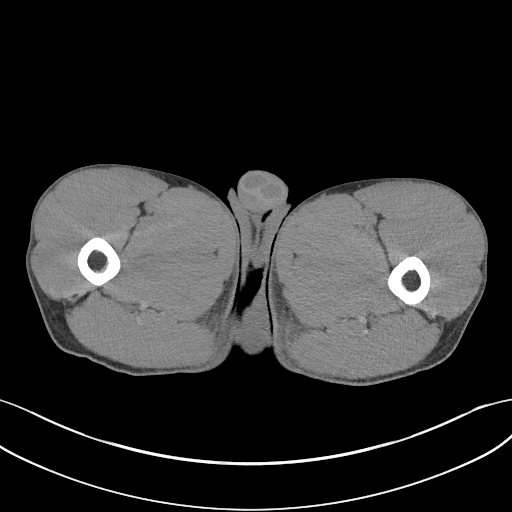
[im 21/93  soft-tissue]
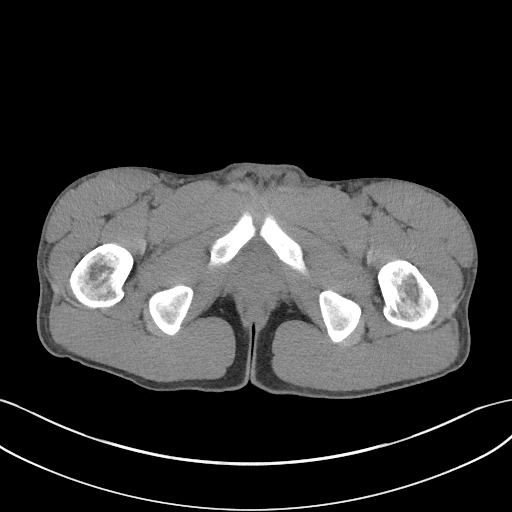
[im 26/93  soft-tissue]
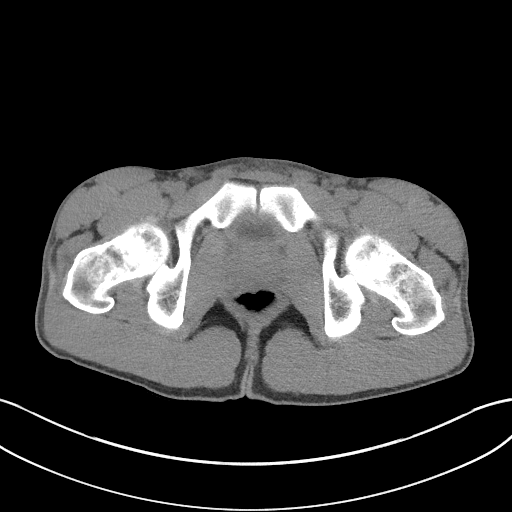
[im 31/93  soft-tissue]
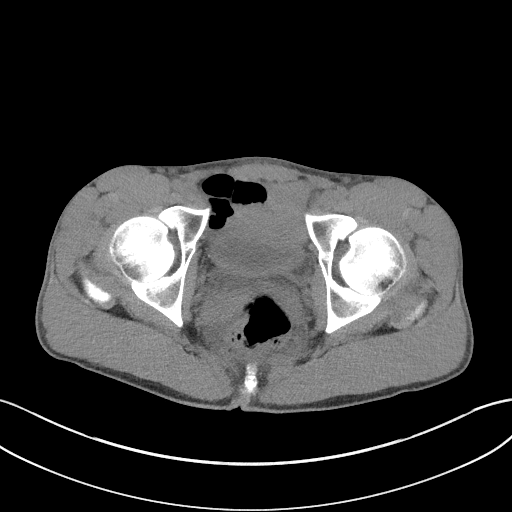
[im 41/93  soft-tissue]
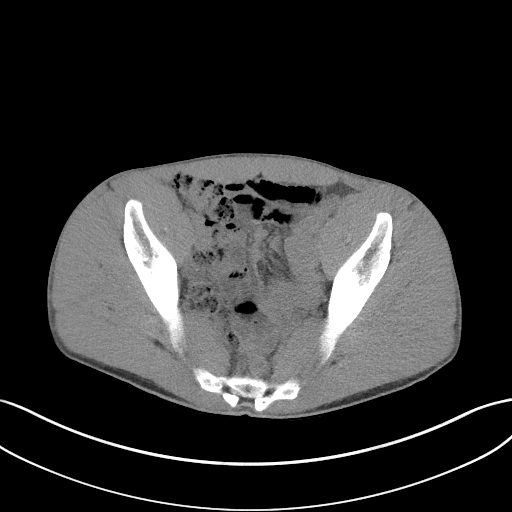
[im 47/93  soft-tissue]
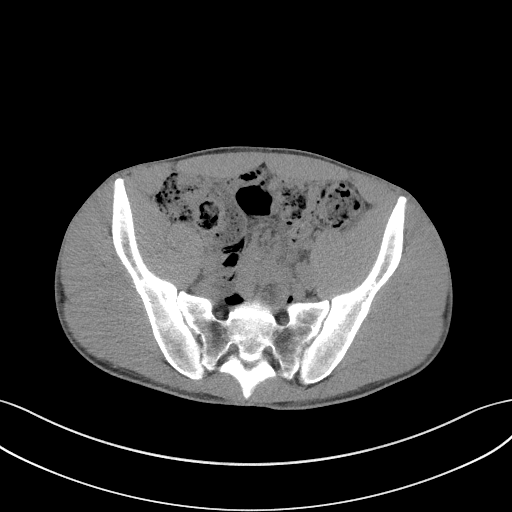
[im 52/93  soft-tissue]
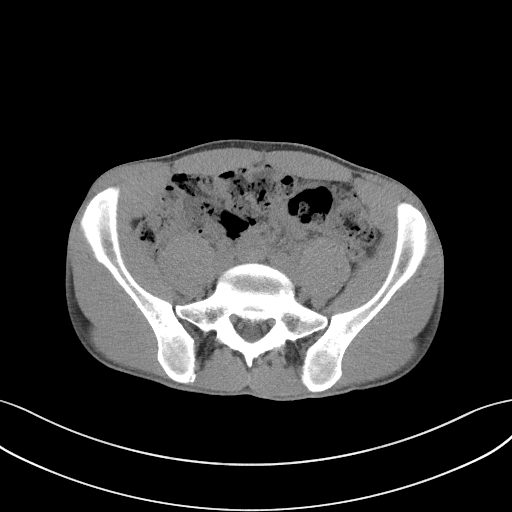
[im 62/93  soft-tissue]
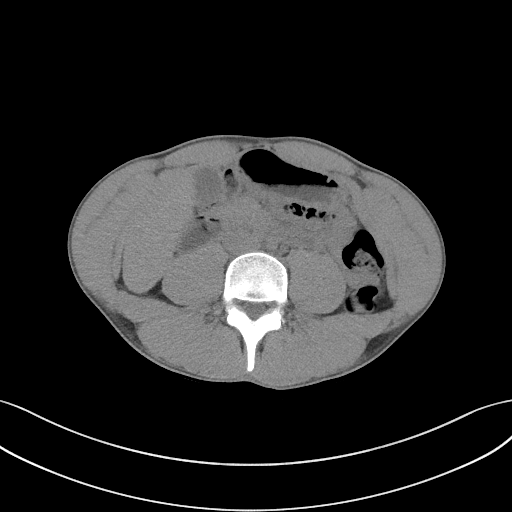
[im 62/93  bone]
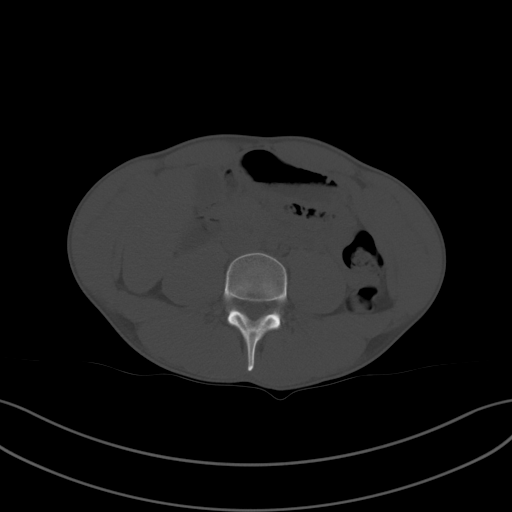
[im 67/93  soft-tissue]
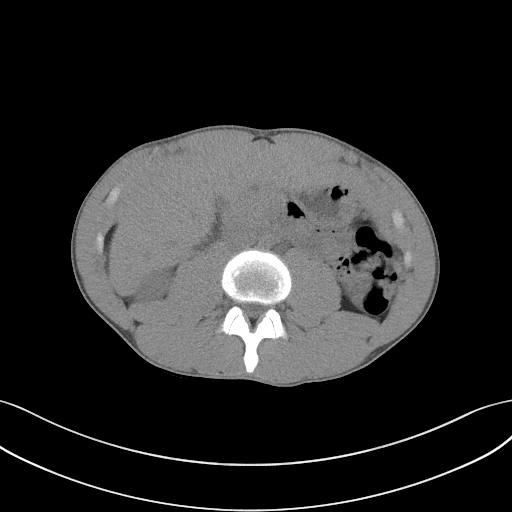
[im 72/93  soft-tissue]
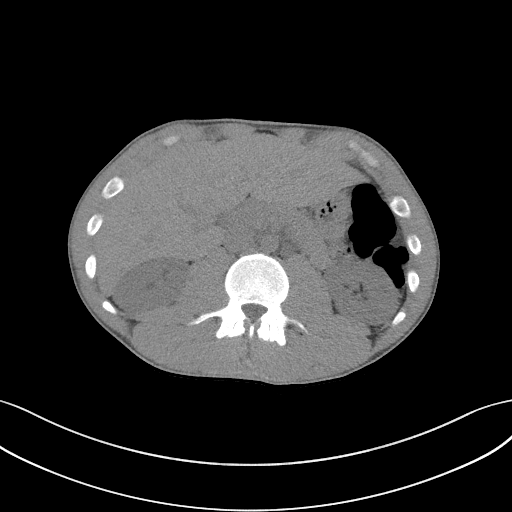
[im 82/93  soft-tissue]
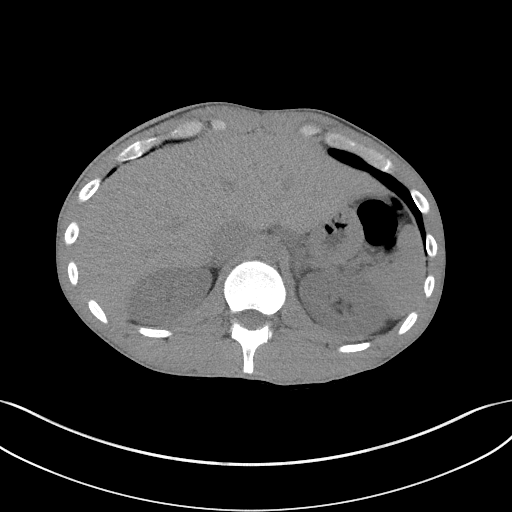
[im 87/93  soft-tissue]
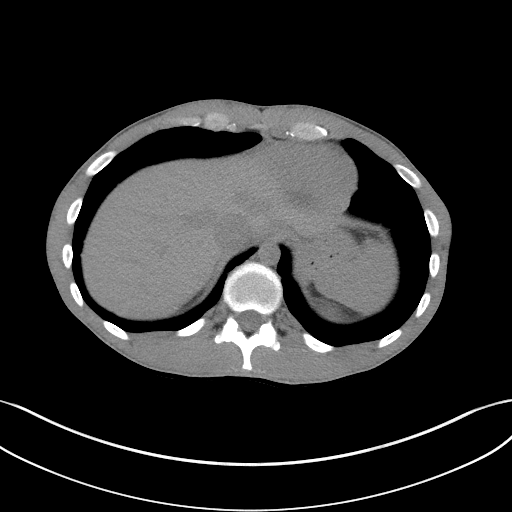

[Series 5: coronal st · coronal · 0.71mm/px · 3 of 83 slices shown]
[im 28/83  soft-tissue]
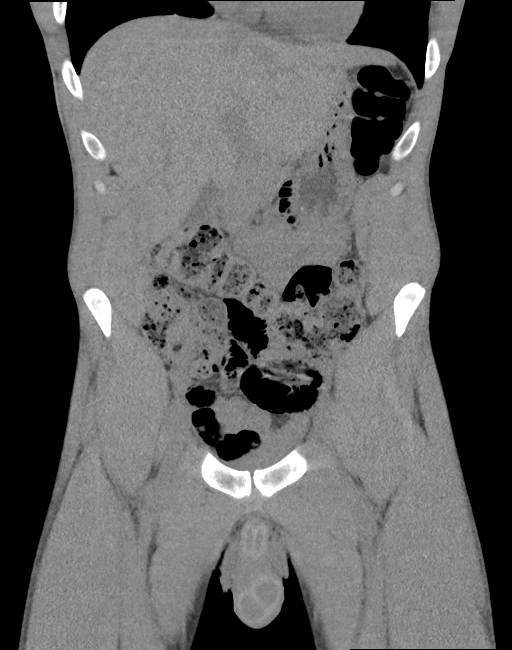
[im 37/83  soft-tissue]
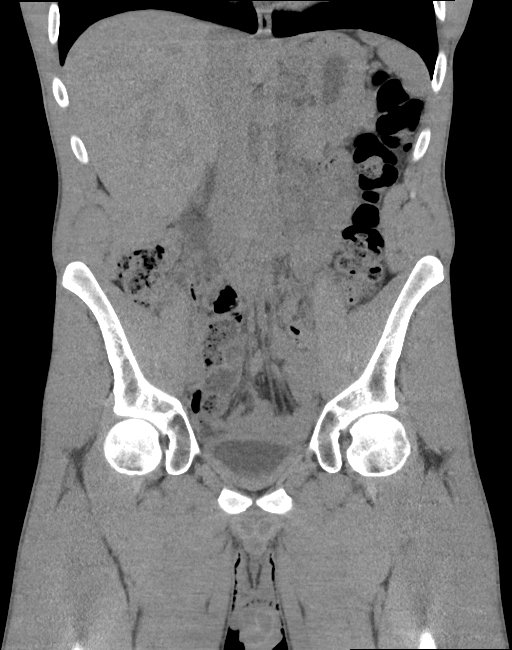
[im 46/83  soft-tissue]
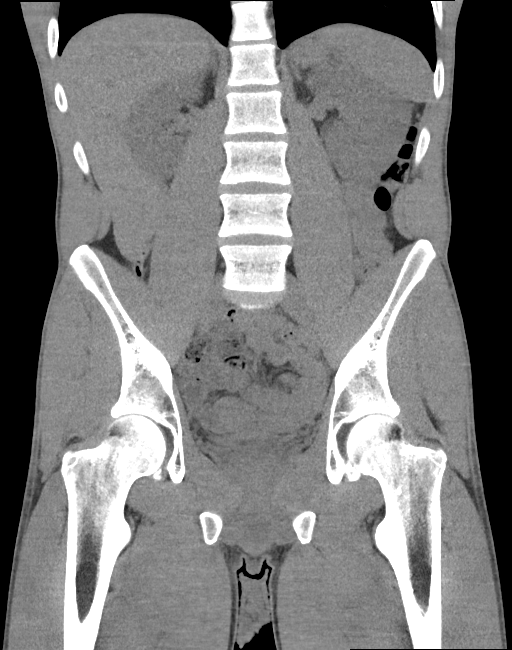

[16 of 46 positions shown; findings below may reference images not displayed]

FINDINGS: Lower chest: Lung bases demonstrate no acute consolidation or
effusion. Normal cardiac size

Hepatobiliary: No focal liver abnormality is seen. No gallstones,
gallbladder wall thickening, or biliary dilatation.

Pancreas: Unremarkable. No pancreatic ductal dilatation or
surrounding inflammatory changes.

Spleen: Normal in size without focal abnormality.

Adrenals/Urinary Tract: Adrenal glands are unremarkable. Kidneys are
normal, without renal calculi, focal lesion, or hydronephrosis.
Bladder is slightly thick walled but under distended.

Stomach/Bowel: Stomach is within normal limits. Appendix appears
normal. No evidence of bowel wall thickening, distention, or
inflammatory changes.

Vascular/Lymphatic: No significant vascular findings are present. No
enlarged abdominal or pelvic lymph nodes.

Reproductive: Prostate is unremarkable.

Other: No abdominal wall hernia or abnormality. No abdominopelvic
ascites.

Musculoskeletal: No acute or significant osseous findings.
IMPRESSION: No CT evidence for acute intra-abdominal or pelvic abnormality.

## 2022-12-09 IMAGING — US US SCROTUM W/ DOPPLER COMPLETE
1 series · 13 of 25 positions shown · non-contrast
Comparison: 12/05/2020, 11/01/2017

CLINICAL DATA: Right testicular pain for 4 days, swelling

EXAM:
SCROTAL ULTRASOUND
DOPPLER ULTRASOUND OF THE TESTICLES
TECHNIQUE: Complete ultrasound examination of the testicles, epididymis, and
other scrotal structures was performed. Color and spectral Doppler
ultrasound were also utilized to evaluate blood flow to the
testicles.

[Series 1: us scrotum w/doppler · 13 of 70 slices shown]
[im 1/70]
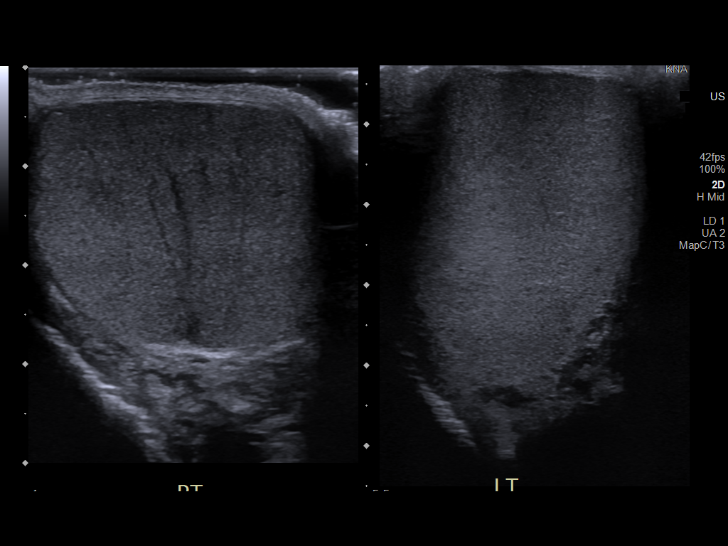
[im 6/70]
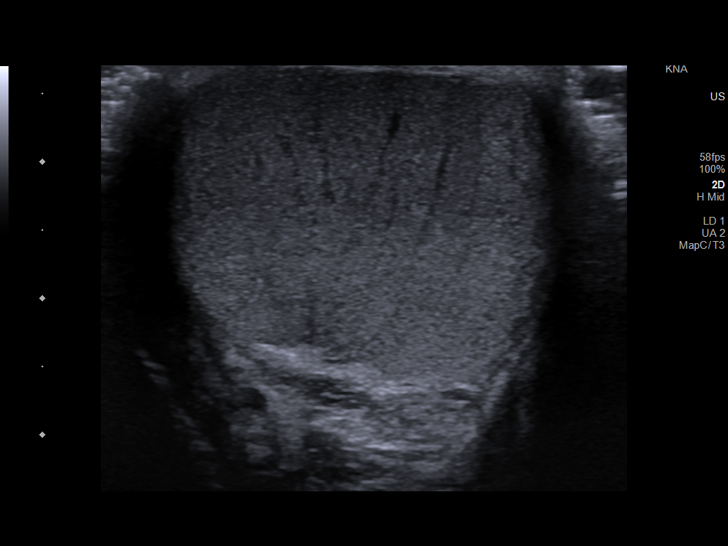
[im 12/70]
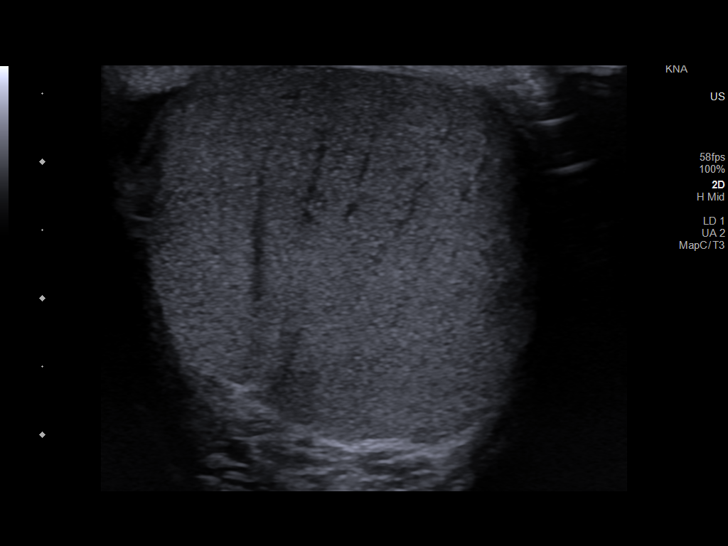
[im 18/70]
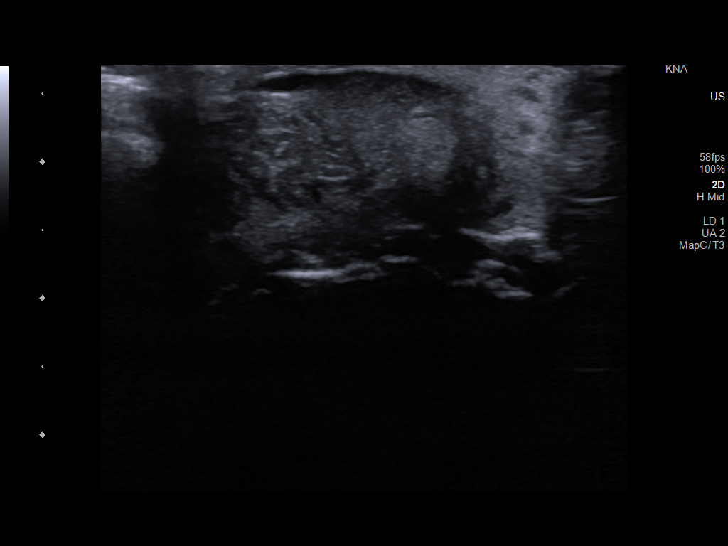
[im 24/70]
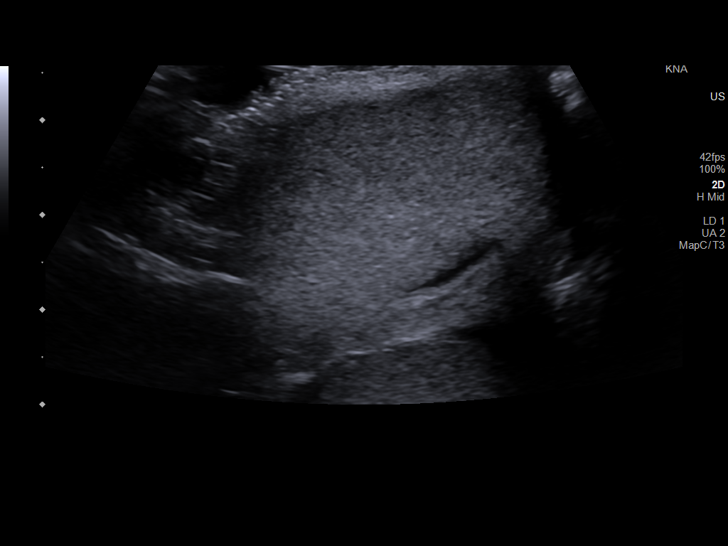
[im 29/70]
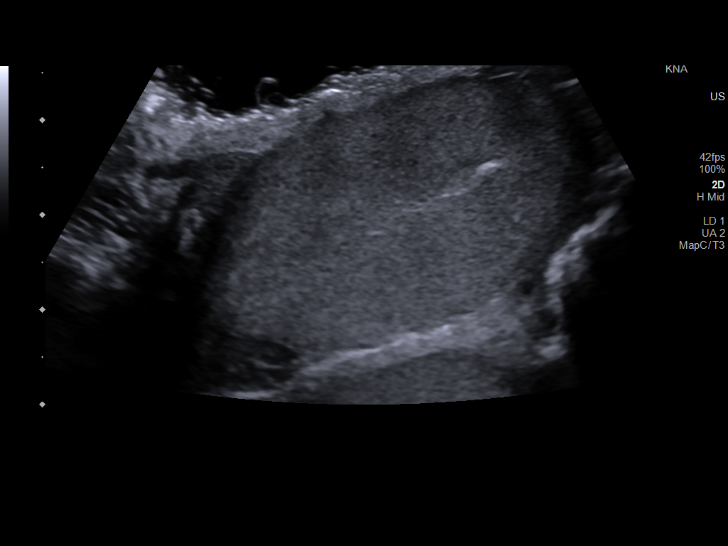
[im 35/70]
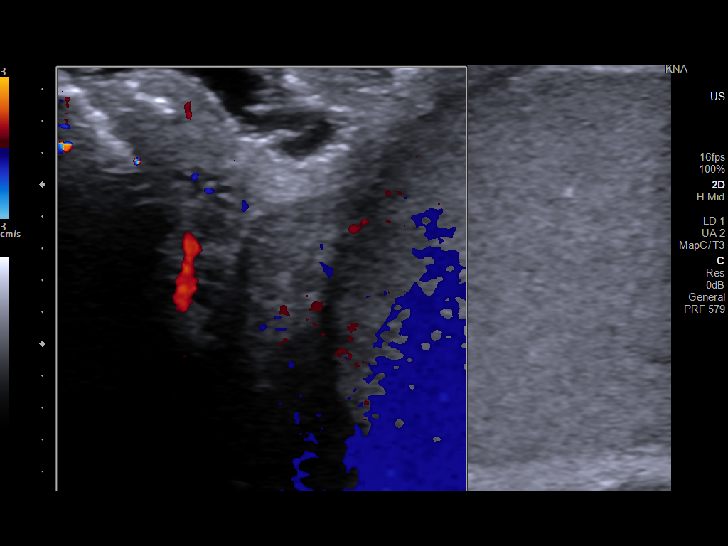
[im 41/70]
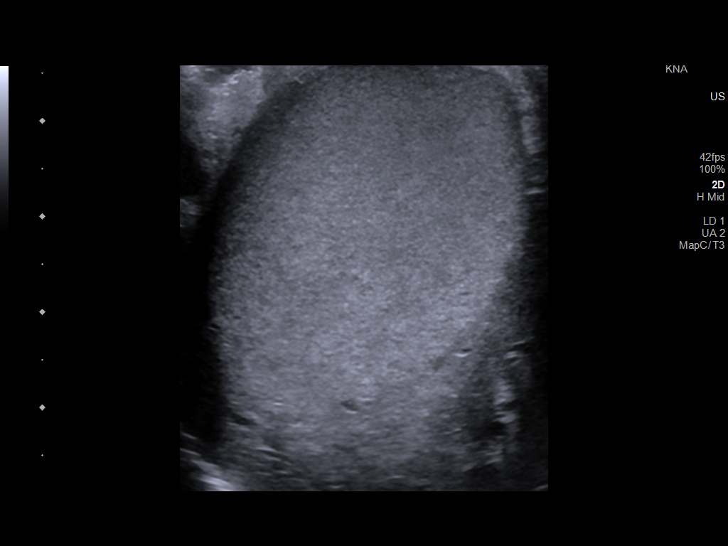
[im 47/70]
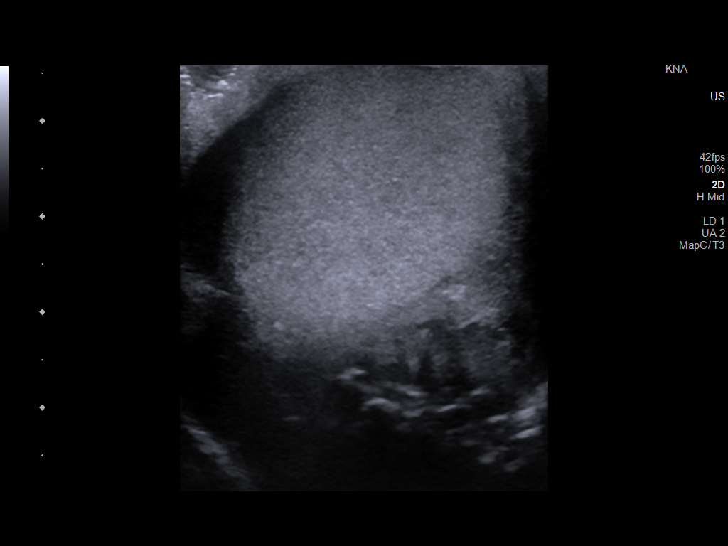
[im 52/70]
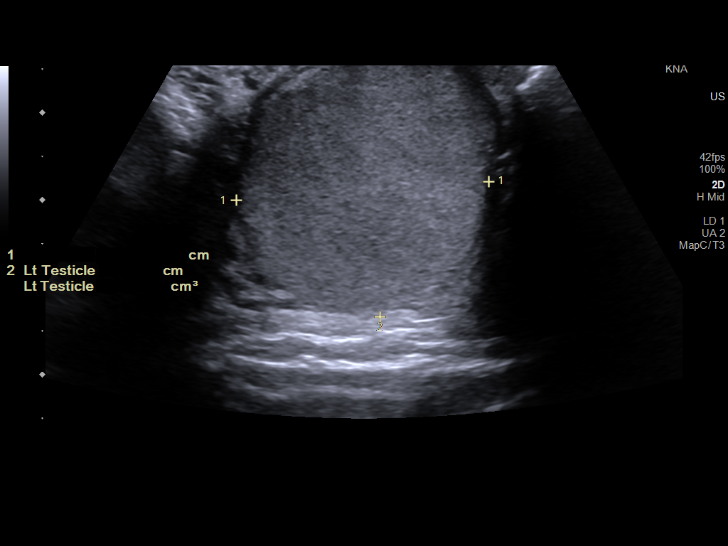
[im 58/70]
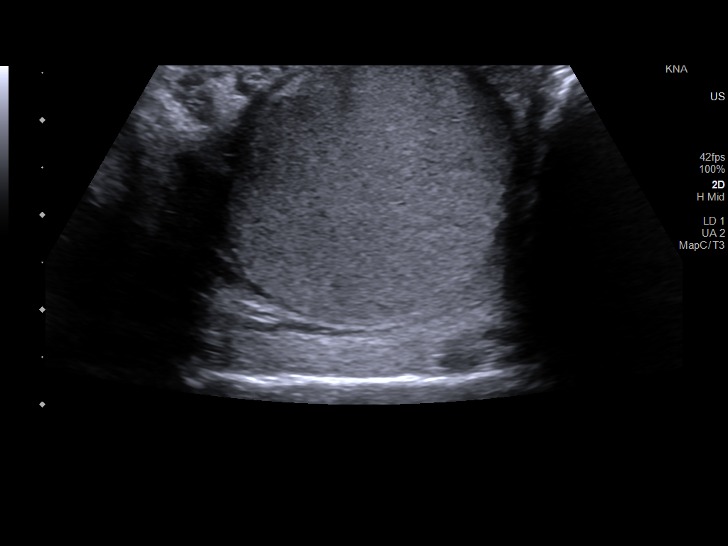
[im 64/70]
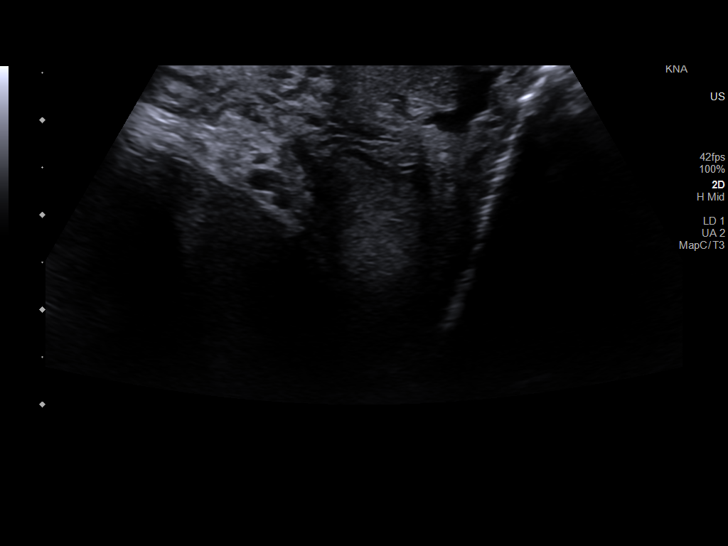
[im 70/70]
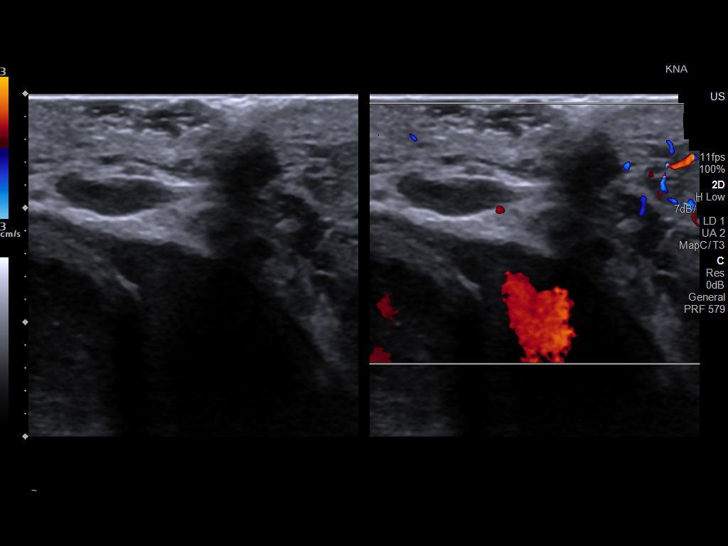

[13 of 25 positions shown; findings below may reference images not displayed]

FINDINGS: Right testicle

Measurements: 3.1 x 5.1 x 2.3 cm. The right testicle demonstrates
mild increased vascularity relative to the left, which could reflect
orchitis. No focal abnormality.

Left testicle

Measurements: 3.3 x 3.0 x 2.9 cm. No mass or microlithiasis
visualized.

Right epididymis:  Normal in size and appearance.

Left epididymis:  Normal in size and appearance.

Hydrocele:  None visualized.

Varicocele:  None visualized.

Pulsed Doppler interrogation of both testes demonstrates normal low
resistance arterial and venous waveforms bilaterally.
IMPRESSION: 1. Mild increased vascularity of the right testicle relative to the
left, which could reflect underlying orchitis.
2. Otherwise unremarkable testicular ultrasound. No sonographic
evidence of torsion.

please pick correct template.

## 2023-03-10 ENCOUNTER — Encounter (HOSPITAL_COMMUNITY): Payer: Self-pay | Admitting: Emergency Medicine

## 2023-03-10 ENCOUNTER — Emergency Department (HOSPITAL_COMMUNITY)
Admission: EM | Admit: 2023-03-10 | Discharge: 2023-03-10 | Disposition: A | Discharge status: Home / Self Care | Payer: Self-pay | Attending: Emergency Medicine | Admitting: Emergency Medicine

## 2023-03-10 ENCOUNTER — Other Ambulatory Visit: Payer: Self-pay

## 2023-03-10 DIAGNOSIS — Z202 Contact with and (suspected) exposure to infections with a predominantly sexual mode of transmission: Secondary | ICD-10-CM | POA: Insufficient documentation

## 2023-03-10 LAB — URINALYSIS, ROUTINE W REFLEX MICROSCOPIC
Bilirubin Urine: NEGATIVE
Glucose, UA: NEGATIVE mg/dL
Hgb urine dipstick: NEGATIVE
Ketones, ur: NEGATIVE mg/dL
Nitrite: NEGATIVE
Protein, ur: NEGATIVE mg/dL
Specific Gravity, Urine: 1.026 (ref 1.005–1.030)
pH: 6 (ref 5.0–8.0)

## 2023-03-10 LAB — HIV ANTIBODY (ROUTINE TESTING W REFLEX): HIV Screen 4th Generation wRfx: NONREACTIVE

## 2023-03-10 LAB — RPR: RPR Ser Ql: NONREACTIVE

## 2023-03-10 MED ORDER — METRONIDAZOLE 500 MG PO TABS
2000.0000 mg | ORAL_TABLET | Freq: Once | ORAL | Status: AC
  Administered 2023-03-10: 2000 mg via ORAL
  Filled 2023-03-10: qty 4

## 2023-03-10 MED ORDER — DOXYCYCLINE HYCLATE 100 MG PO CAPS: 100.0000 mg | ORAL_CAPSULE | Freq: Two times a day (BID) | ORAL | 0 refills | Status: DC

## 2023-03-10 MED ORDER — DOXYCYCLINE HYCLATE 100 MG PO TABS
100.0000 mg | ORAL_TABLET | Freq: Once | ORAL | Status: AC
  Administered 2023-03-10: 100 mg via ORAL
  Filled 2023-03-10: qty 1

## 2023-03-10 MED ORDER — CEFTRIAXONE SODIUM 500 MG IJ SOLR
500.0000 mg | Freq: Once | INTRAMUSCULAR | Status: AC
  Administered 2023-03-10: 500 mg via INTRAMUSCULAR
  Filled 2023-03-10: qty 500

## 2023-03-10 MED ORDER — STERILE WATER FOR INJECTION IJ SOLN
INTRAMUSCULAR | Status: AC
  Administered 2023-03-10: 1 mL
  Filled 2023-03-10: qty 10

## 2023-03-10 NOTE — ED Provider Notes (Signed)
[place] EMERGENCY DEPARTMENT AT [facility] Provider Note   CSN: [id number] Arrival date & time: 03/10/23  0031     History  Chief Complaint  Patient presents with   Exposure to STD    ZHEN AUGUSTA is a 27 y.o. male.  The history is provided by the patient.  Exposure to STD  He comes in for evaluation for sexually transmitted infection.  He states that his partner was told that she had come in contact with trichomonas.  He has had a white discharge but denies any dysuria.   Home Medications Prior to Admission medications   Medication Sig Start Date End Date Taking? Authorizing Provider  doxycycline (VIBRAMYCIN) 100 MG capsule Take 1 capsule (100 mg total) by mouth 2 (two) times daily. 11/21/22   Elson Areas, PA-C      Allergies    Bee venom and Penicillins    Review of Systems   Review of Systems  All other systems reviewed and are negative.   Physical Exam Updated Vital Signs BP 119/77   Pulse 96   Temp 98.3 F (36.8 C) (Oral)   Resp 16   Ht 6\' 1"  (1.854 m)   Wt 68 kg   SpO2 98%   BMI 19.79 kg/m  Physical Exam Vitals and nursing note reviewed.   27 year old male, resting comfortably and in no acute distress. Vital signs are normal. Oxygen saturation is 98%, which is normal. Head is normocephalic and atraumatic. PERRLA, EOMI.  Lungs are clear without rales, wheezes, or rhonchi. Chest is nontender. Heart has regular rate and rhythm without murmur. Abdomen is soft, flat, nontender. Genitalia: Circumcised penis, no discharge seen.  Testes descended without masses.  Shotty inguinal adenopathy present bilaterally. Extremities have no cyanosis or edema, full range of motion is present. Skin is warm and dry without rash. Neurologic: Mental status is normal, cranial nerves are intact, moves all extremities equally.  ED Results / Procedures / Treatments   Labs (all labs ordered are listed, but only abnormal results are displayed) Labs  Reviewed  URINALYSIS, ROUTINE W REFLEX MICROSCOPIC - Abnormal; Notable for the following components:      Result Value   APPearance HAZY (*)    Leukocytes,Ua TRACE (*)    Bacteria, UA RARE (*)    All other components within normal limits  RPR  HIV ANTIBODY (ROUTINE TESTING W REFLEX)  GC/CHLAMYDIA PROBE AMP ([place]) NOT AT [facility]   Procedures Procedures    Medications Ordered in ED Medications  metroNIDAZOLE (FLAGYL) tablet 2,000 mg (has no administration in time range)  cefTRIAXone (ROCEPHIN) injection 500 mg (has no administration in time range)  doxycycline (VIBRA-TABS) tablet 100 mg (has no administration in time range)    ED Course/ Medical Decision Making/ A&P                                 Medical Decision Making Amount and/or Complexity of Data Reviewed Labs: ordered.   Exposure to trichomonas.  I have ordered a sexually-transmitted infection panel.  I have also offered patient the option of being treated for gonorrhea and chlamydia at the same time and he has agreed to this.  I have ordered a dose of metronidazole, ceftriaxone, doxycycline and I am discharging him with a prescription for doxycycline.  I have reviewed his past records, and he has multiple ED visits for sexually transmitted infections.  I have  encouraged him to use safe sex practices.  Final Clinical Impression(s) / ED Diagnoses Final diagnoses:  STD exposure    Rx / DC Orders ED Discharge Orders          Ordered    doxycycline (VIBRAMYCIN) 100 MG capsule  2 times daily        03/10/23 0227              Dione Booze, MD 03/10/23 [phone]

## 2023-03-10 NOTE — ED Triage Notes (Signed)
Pt requesting testing after being exposed to STD. Endorses penile discharge for approx 3-4 days. Denies pain.

## 2023-03-13 ENCOUNTER — Telehealth: Payer: Self-pay

## 2023-03-13 NOTE — Telephone Encounter (Signed)
Completed a hospital f/u call as a result of the pt being seen on 8.31.24 per ER visit  Successully spoke with patient and he states he has no current medical needs as it relates to medications or any additional medical concerns since being released from hospital ER/      Pt continues to be connected with the [facility] for mostly sexually transmitted disease needs but not PCP needs, in which he last received annual physical one (1) year ago.     Pt was re-educated on the use of Urgent vs Emergency room in the event his PCP office is not available or open for business.    Urgent vs emergency conditions were also reviewed with the patient.   Also reviewed pt's uninsured status and encouraged to consider renewal enrollment with the [facility] [place] and to also receive assistance with obtaining Medicaid to also be able to assist him with staying more connected with his medical provider of choice at the health dept..  Pt decided to make an appointment with the [facility] [place] for Thursday, Sept 5 , 2024 at 2:00 pm..  Also encouraged pt to receive an annual wellness exam and to obtain assistance with getting an appointment with [facility] adult clinic who he chooses to remain connected as a primary care provider/.  All documents were reviewed with pt to bring with him to the appt.   Pt states he understood and  call ended

## 2023-03-15 LAB — GC/CHLAMYDIA PROBE AMP (~~LOC~~) NOT AT ARMC
Chlamydia: NEGATIVE
Comment: NEGATIVE
Comment: NORMAL
Neisseria Gonorrhea: NEGATIVE

## 2023-10-20 ENCOUNTER — Emergency Department (HOSPITAL_COMMUNITY)
Admission: EM | Admit: 2023-10-20 | Discharge: 2023-10-21 | Disposition: A | Discharge status: Home / Self Care | Payer: Self-pay | Attending: Emergency Medicine | Admitting: Emergency Medicine

## 2023-10-20 ENCOUNTER — Other Ambulatory Visit: Payer: Self-pay

## 2023-10-20 ENCOUNTER — Encounter (HOSPITAL_COMMUNITY): Payer: Self-pay

## 2023-10-20 DIAGNOSIS — Z202 Contact with and (suspected) exposure to infections with a predominantly sexual mode of transmission: Secondary | ICD-10-CM | POA: Insufficient documentation

## 2023-10-20 DIAGNOSIS — R369 Urethral discharge, unspecified: Secondary | ICD-10-CM | POA: Insufficient documentation

## 2023-10-20 NOTE — ED Provider Notes (Signed)
  EMERGENCY DEPARTMENT AT [facility] Provider Note   CSN: [id number] Arrival date & time: 10/20/23  2234     History {Add pertinent medical, surgical, social history, OB history to HPI:1} Chief Complaint  Patient presents with   Exposure to STD    Bobby Scott is a 28 y.o. male.   Exposure to STD  Patient presents for STD exposure.  Medical history includes prior STIs.  He has had recent cloudy penile discharge.  He denies any other recent symptoms.  He has had trichomonas infection in the past and feels like his symptoms are similar.      Home Medications Prior to Admission medications   Medication Sig Start Date End Date Taking? Authorizing Provider  doxycycline (VIBRAMYCIN) 100 MG capsule Take 1 capsule (100 mg total) by mouth 2 (two) times daily. 03/10/23   Alissa April, MD      Allergies    Bee venom and Penicillins    Review of Systems   Review of Systems  Genitourinary:  Positive for penile discharge.  All other systems reviewed and are negative.   Physical Exam Updated Vital Signs BP 112/70 (BP Location: Right Arm)   Pulse 62   Temp 98.8 F (37.1 C) (Oral)   Resp 18   Ht 6\' 3"  (1.905 m)   Wt 68 kg   SpO2 100%   BMI 18.75 kg/m  Physical Exam Vitals and nursing note reviewed.  Constitutional:      General: He is not in acute distress.    Appearance: Normal appearance. He is well-developed. He is not ill-appearing, toxic-appearing or diaphoretic.  HENT:     Head: Normocephalic and atraumatic.     Right Ear: External ear normal.     Left Ear: External ear normal.     Nose: Nose normal.     Mouth/Throat:     Mouth: Mucous membranes are moist.  Eyes:     Extraocular Movements: Extraocular movements intact.     Conjunctiva/sclera: Conjunctivae normal.  Cardiovascular:     Rate and Rhythm: Normal rate and regular rhythm.  Pulmonary:     Effort: Pulmonary effort is normal. No respiratory distress.  Abdominal:     General:  There is no distension.     Palpations: Abdomen is soft.  Musculoskeletal:        General: No swelling. Normal range of motion.     Cervical back: Normal range of motion and neck supple.  Skin:    General: Skin is warm and dry.     Coloration: Skin is not jaundiced or pale.  Neurological:     General: No focal deficit present.     Mental Status: He is alert and oriented to person, place, and time.  Psychiatric:        Mood and Affect: Mood normal.        Behavior: Behavior normal.     ED Results / Procedures / Treatments   Labs (all labs ordered are listed, but only abnormal results are displayed) Labs Reviewed - No data to display  EKG None  Radiology No results found.  Procedures Procedures  {Document cardiac monitor, telemetry assessment procedure when appropriate:1}  Medications Ordered in ED Medications - No data to display  ED Course/ Medical Decision Making/ A&P   {   Click here for ABCD2, HEART and other calculatorsREFRESH Note before signing :1}  Medical Decision Making Amount and/or Complexity of Data Reviewed Labs: ordered.   Patient presenting for concern of STI.  He has had a recent cloudy penile discharge that he says is reminiscent of a prior trichomonas infection.  He denies any other recent symptoms.  Patient was given wet prep swab to obtain expressed pus from penis.  Will test for GC/chlamydia, RPR, and HIV as well.***  {Document critical care time when appropriate:1} {Document review of labs and clinical decision tools ie heart score, Chads2Vasc2 etc:1}  {Document your independent review of radiology images, and any outside records:1} {Document your discussion with family members, caretakers, and with consultants:1} {Document social determinants of health affecting pt's care:1} {Document your decision making why or why not admission, treatments were needed:1} Final Clinical Impression(s) / ED Diagnoses Final  diagnoses:  None    Rx / DC Orders ED Discharge Orders     None

## 2023-10-20 NOTE — ED Triage Notes (Signed)
 Pt arrived from home via POV c/o exposure to trich and would like to be tested for all STDs and treated. Pt states that he has noticed cloudy penile discharge.

## 2023-10-21 LAB — RAPID HIV SCREEN (HIV 1/2 AB+AG)
HIV 1/2 Antibodies: NONREACTIVE
HIV-1 P24 Antigen - HIV24: NONREACTIVE

## 2023-10-21 LAB — WET PREP, GENITAL
Clue Cells Wet Prep HPF POC: NONE SEEN
Sperm: NONE SEEN
Trich, Wet Prep: NONE SEEN
WBC, Wet Prep HPF POC: <10 — AB (ref <10)
Yeast Wet Prep HPF POC: NONE SEEN

## 2023-10-21 MED ORDER — CEFTRIAXONE SODIUM 500 MG IJ SOLR
500.0000 mg | Freq: Once | INTRAMUSCULAR | Status: AC
  Administered 2023-10-21: 500 mg via INTRAMUSCULAR
  Filled 2023-10-21: qty 500

## 2023-10-21 MED ORDER — LIDOCAINE HCL (PF) 1 % IJ SOLN
1.0000 mL | Freq: Once | INTRAMUSCULAR | Status: AC
  Administered 2023-10-21: 1 mL
  Filled 2023-10-21: qty 5

## 2023-10-21 MED ORDER — DOXYCYCLINE HYCLATE 100 MG PO TABS: 100.0000 mg | ORAL_TABLET | Freq: Two times a day (BID) | ORAL | 0 refills | Status: DC

## 2023-10-21 NOTE — ED Notes (Signed)
 This nurse called lab to verify the results of the wet prep due to the previous conversation stating they did not have the appropriate specimen. Lab personnel said the results that are showing are from a different patient and this patient still needs to be collected. MD notified and this patient recollected using swab given by lab staff

## 2023-10-21 NOTE — ED Notes (Signed)
 Lab called this nurse and stated they did not have a proper specimen for the wet prep. ED tech walked over to lab to confirm which swab is supposed to be used for this test.

## 2023-10-21 NOTE — Discharge Instructions (Addendum)
 The microscope testing did not show any trichomonas.  You were tested for gonorrhea and chlamydia.  This test result will not be available for the next 2 days.  Based on yours and your partner's recent symptoms, we will treat you for gonorrhea and chlamydia.  A prescription for antibiotics was sent to your pharmacy.  Take as prescribed for the next week.

## 2023-10-22 LAB — GC/CHLAMYDIA PROBE AMP (~~LOC~~) NOT AT ARMC
Chlamydia: NEGATIVE
Comment: NEGATIVE
Comment: NORMAL
Neisseria Gonorrhea: NEGATIVE

## 2023-10-22 LAB — RPR: RPR Ser Ql: NONREACTIVE

## 2023-10-24 NOTE — Congregational Nurse Program (Signed)
 Made first attempt to f/u with pt by phone and text message but pt was unavailable at the time of the call.     This call was in attempt to provide nurse case management to ensure pt did not have any additional questions or concerns regarding any post hospital instruction,medical concerns, obtaining her medications and serving as a reminder of any upcoming appointments.   In addition, this was an attempt to get this pt re-connected to a PCP and to the [facility].    Will attempt to make additional phone contact, if pt does attempt  to return call first

## 2023-10-29 NOTE — Congregational Nurse Program (Signed)
 Made 2nd attempt to f/u with pt by phone but pt was unavailable once again at this time of the call.      This call was in attempt to provide nurse case management to ensure pt did not have any additional questions or concerns regarding any post hospital instruction,medical concerns, obtaining her medications and serving as a reminder of any upcoming appointments.   In addition, this was an attempt to get this pt re-connected to a PCP and to the [facility].     Will attempt to make additional phone contact, if pt does attempt to return call first

## 2024-05-10 ENCOUNTER — Emergency Department (HOSPITAL_COMMUNITY)

## 2024-05-10 ENCOUNTER — Encounter (HOSPITAL_COMMUNITY): Payer: Self-pay | Admitting: Emergency Medicine

## 2024-05-10 ENCOUNTER — Emergency Department (HOSPITAL_COMMUNITY)
Admission: EM | Admit: 2024-05-10 | Discharge: 2024-05-10 | Disposition: A | Discharge status: Home / Self Care | Attending: Emergency Medicine | Admitting: Emergency Medicine

## 2024-05-10 ENCOUNTER — Other Ambulatory Visit: Payer: Self-pay

## 2024-05-10 DIAGNOSIS — R059 Cough, unspecified: Secondary | ICD-10-CM | POA: Diagnosis present

## 2024-05-10 DIAGNOSIS — R300 Dysuria: Secondary | ICD-10-CM | POA: Insufficient documentation

## 2024-05-10 DIAGNOSIS — J40 Bronchitis, not specified as acute or chronic: Secondary | ICD-10-CM | POA: Diagnosis not present

## 2024-05-10 DIAGNOSIS — A64 Unspecified sexually transmitted disease: Secondary | ICD-10-CM | POA: Insufficient documentation

## 2024-05-10 LAB — URINALYSIS, ROUTINE W REFLEX MICROSCOPIC
Bacteria, UA: NONE SEEN
Bilirubin Urine: NEGATIVE
Glucose, UA: NEGATIVE mg/dL
Hgb urine dipstick: NEGATIVE
Ketones, ur: NEGATIVE mg/dL
Nitrite: NEGATIVE
Protein, ur: NEGATIVE mg/dL
Specific Gravity, Urine: 1.027 (ref 1.005–1.030)
pH: 5 (ref 5.0–8.0)

## 2024-05-10 LAB — RESP PANEL BY RT-PCR (RSV, FLU A&B, COVID)  RVPGX2
Influenza A by PCR: NEGATIVE
Influenza B by PCR: NEGATIVE
Resp Syncytial Virus by PCR: NEGATIVE
SARS Coronavirus 2 by RT PCR: NEGATIVE

## 2024-05-10 LAB — HIV ANTIBODY (ROUTINE TESTING W REFLEX): HIV Screen 4th Generation wRfx: NONREACTIVE

## 2024-05-10 MED ORDER — CEFTRIAXONE SODIUM 500 MG IJ SOLR
500.0000 mg | Freq: Once | INTRAMUSCULAR | Status: AC
  Administered 2024-05-10: 500 mg via INTRAMUSCULAR
  Filled 2024-05-10: qty 500

## 2024-05-10 MED ORDER — ALBUTEROL SULFATE HFA 108 (90 BASE) MCG/ACT IN AERS
1.0000 | INHALATION_SPRAY | Freq: Four times a day (QID) | RESPIRATORY_TRACT | 0 refills | Status: AC | PRN
Start: 2024-05-10 — End: ?

## 2024-05-10 MED ORDER — PREDNISONE 10 MG PO TABS: 50.0000 mg | ORAL_TABLET | Freq: Every day | ORAL | 0 refills | Status: AC

## 2024-05-10 MED ORDER — DOXYCYCLINE HYCLATE 100 MG PO TABS: 100.0000 mg | ORAL_TABLET | Freq: Two times a day (BID) | ORAL | 0 refills | Status: AC

## 2024-05-10 NOTE — ED Provider Notes (Signed)
 [place] [facility] DEPARTMENT AT [facility] Provider Note   CSN: [id number] Arrival date & time: 05/10/24  9452     Patient presents with: Cough and STD check   Bobby Scott is a 28 y.o. male.   HPI    28 year old male comes in with chief complaint of burning with urination and cough.  Patient states that he was just released from prison and had unprotected sex.  Now he is having burning with urination.  He has not seen any penile discharge.  Additionally he is having cough for the last few days.  Cough is now producing phlegm that has blood in it.  Patient denies any wheezing.  He does not have any underlying lung disease history.  Patient has pain with cough, but no pain with breathing. Pt has no hx of PE, DVT and denies any exogenous hormone (testosterone / estrogen) use, long distance travels or surgery in the past 6 weeks, active cancer, recent immobilization.   Prior to Admission medications   Medication Sig Start Date End Date Taking? Authorizing Provider  albuterol (VENTOLIN HFA) 108 (90 Base) MCG/ACT inhaler Inhale 1-2 puffs into the lungs every 6 (six) hours as needed for wheezing or shortness of breath. 05/10/24  Yes Charlyn Sora, MD  doxycycline  (VIBRA -TABS) 100 MG tablet Take 1 tablet (100 mg total) by mouth 2 (two) times daily for 7 days. 05/10/24 05/17/24 Yes Charlyn Sora, MD  predniSONE (DELTASONE) 10 MG tablet Take 5 tablets (50 mg total) by mouth daily. 05/10/24  Yes Charlyn Sora, MD    Allergies: Bee venom and Penicillins    Review of Systems  All other systems reviewed and are negative.   Updated Vital Signs BP 128/71 (BP Location: Right Arm)   Pulse 75   Temp 98.1 F (36.7 C) (Oral)   Resp 16   Ht 6' 3 (1.905 m)   Wt 68 kg   SpO2 100%   BMI 18.75 kg/m   Physical Exam Vitals and nursing note reviewed.  Constitutional:      Appearance: He is well-developed.  HENT:     Head: Atraumatic.  Eyes:     Extraocular Movements:  Extraocular movements intact.     Pupils: Pupils are equal, round, and reactive to light.  Cardiovascular:     Rate and Rhythm: Normal rate.  Pulmonary:     Effort: Pulmonary effort is normal.     Breath sounds: No wheezing, rhonchi or rales.  Musculoskeletal:     Cervical back: Neck supple.  Skin:    General: Skin is warm.  Neurological:     Mental Status: He is alert and oriented to person, place, and time.     (all labs ordered are listed, but only abnormal results are displayed) Labs Reviewed  RESP PANEL BY RT-PCR (RSV, FLU A&B, COVID)  [id number]  RPR  URINALYSIS, ROUTINE W REFLEX MICROSCOPIC  HIV ANTIBODY (ROUTINE TESTING W REFLEX)  GC/CHLAMYDIA PROBE AMP ([place]) NOT AT [facility]    EKG: None  Radiology: DG Chest Portable 1 View Result Date: 05/10/2024 EXAM: 1 VIEW(S) XRAY OF THE CHEST 05/10/2024 06:28:10 AM COMPARISON: AP chest radiograph dated 06/30/2022. CLINICAL HISTORY: Cough FINDINGS: LUNGS AND PLEURA: No focal pulmonary opacity. No pulmonary edema. No pleural effusion. No pneumothorax. HEART AND MEDIASTINUM: No acute abnormality of the cardiac and mediastinal silhouettes. BONES AND SOFT TISSUES: No acute osseous abnormality. IMPRESSION: 1. No acute process. Electronically signed by: Evalene Coho MD 05/10/2024 06:41 AM EDT RP Workstation: [id number]  Procedures   Medications Ordered in the ED  cefTRIAXone  (ROCEPHIN ) injection 500 mg (has no administration in time range)                                    Medical Decision Making Amount and/or Complexity of Data Reviewed Labs: ordered. Radiology: ordered.  Risk Prescription drug management.   28 year old male comes in with chief complaint of cough with bloody sputum and also burning with urination in the setting of high risk sexual behavior.  Differential diagnosis for burning with urination includes UTI, urethritis, STD. Differential diagnosis for bloody phlegm includes bronchitis, PE,  pneumonia.  X-ray of the chest ordered and independently interpreted by me.  There is no evidence of focal consolidation.  Patient has clear lung exam.  At this age, chances of UTI is much lower than STD.  Patient in agreement for empiric treatment for gonococcal and chlamydia infection.  He will receive ceftriaxone  IM here and be discharged with Doxy twice daily which should help with both atypical infection and STD.  Will also give him prednisone.   Final diagnoses:  Bronchitis  Dysuria  STD (male)    ED Discharge Orders          Ordered    doxycycline  (VIBRA -TABS) 100 MG tablet  2 times daily        05/10/24 0733    predniSONE (DELTASONE) 10 MG tablet  Daily        05/10/24 0755    albuterol (VENTOLIN HFA) 108 (90 Base) MCG/ACT inhaler  Every 6 hours PRN        05/10/24 0759               Charlyn Sora, MD 05/10/24 0800

## 2024-05-10 NOTE — ED Triage Notes (Signed)
 Pt  with c/o cough and chest congestion and wants an STD check while he's here.

## 2024-05-10 NOTE — Discharge Instructions (Signed)
 You were seen in the ER for cough with bloody phlegm and also burning with urination. Please take the antibiotics that are prescribed, it should treat both bronchitis and STD.  Return to the ER if you start having severe chest pain, shortness of breath, near fainting or fainting, increasing blood in your phlegm.  Read the instructions provided.

## 2024-05-11 LAB — RPR: RPR Ser Ql: NONREACTIVE

## 2024-05-12 LAB — GC/CHLAMYDIA PROBE AMP (~~LOC~~) NOT AT ARMC
Chlamydia: NEGATIVE
Comment: NEGATIVE
Comment: NORMAL
Neisseria Gonorrhea: POSITIVE — AB

## 2024-05-13 ENCOUNTER — Ambulatory Visit (HOSPITAL_COMMUNITY): Payer: Self-pay

## 2024-06-30 ENCOUNTER — Emergency Department (HOSPITAL_COMMUNITY)
Admission: EM | Admit: 2024-06-30 | Discharge: 2024-06-30 | Discharge status: Against Medical Advice | Attending: Emergency Medicine | Admitting: Emergency Medicine

## 2024-06-30 ENCOUNTER — Other Ambulatory Visit: Payer: Self-pay

## 2024-06-30 ENCOUNTER — Encounter (HOSPITAL_COMMUNITY): Payer: Self-pay

## 2024-06-30 DIAGNOSIS — R300 Dysuria: Secondary | ICD-10-CM | POA: Insufficient documentation

## 2024-06-30 DIAGNOSIS — Z5321 Procedure and treatment not carried out due to patient leaving prior to being seen by health care provider: Secondary | ICD-10-CM | POA: Insufficient documentation

## 2024-06-30 NOTE — ED Triage Notes (Signed)
 Pt arrived via POV c/o discharge and dysuria. Pt seen recently for STD and reports symptoms have been on-going.
